# Patient Record
Sex: Male | Born: 1960 | Race: White | Hispanic: No | Marital: Married | Smoking: Current some day smoker
Health system: Southern US, Community
[De-identification: ages and names within clinical notes are randomized; demographics above are authoritative.]

## PROBLEM LIST (undated history)

## (undated) DIAGNOSIS — K219 Gastro-esophageal reflux disease without esophagitis: Secondary | ICD-10-CM

## (undated) DIAGNOSIS — E785 Hyperlipidemia, unspecified: Secondary | ICD-10-CM

## (undated) HISTORY — DX: Gastro-esophageal reflux disease without esophagitis: K21.9

## (undated) HISTORY — PX: OTHER SURGICAL HISTORY: SHX169

## (undated) HISTORY — DX: Hyperlipidemia, unspecified: E78.5

## (undated) HISTORY — PX: TONSILLECTOMY: SUR1361

---

## 2005-03-30 ENCOUNTER — Ambulatory Visit: Payer: Self-pay | Admitting: Family Medicine

## 2005-05-04 ENCOUNTER — Ambulatory Visit: Payer: Self-pay | Admitting: Family Medicine

## 2005-07-08 ENCOUNTER — Ambulatory Visit: Payer: Self-pay | Admitting: Family Medicine

## 2005-09-10 ENCOUNTER — Ambulatory Visit: Payer: Self-pay | Admitting: Family Medicine

## 2005-09-11 ENCOUNTER — Ambulatory Visit: Payer: Self-pay | Admitting: Internal Medicine

## 2006-01-19 ENCOUNTER — Ambulatory Visit: Payer: Self-pay | Admitting: Family Medicine

## 2006-01-19 ENCOUNTER — Encounter: Admission: RE | Admit: 2006-01-19 | Discharge: 2006-01-19 | Payer: Self-pay | Admitting: Family Medicine

## 2006-01-25 ENCOUNTER — Ambulatory Visit: Payer: Self-pay | Admitting: Family Medicine

## 2006-05-05 ENCOUNTER — Ambulatory Visit (HOSPITAL_COMMUNITY): Admission: RE | Admit: 2006-05-05 | Discharge: 2006-05-05 | Payer: Self-pay | Admitting: Neurosurgery

## 2006-05-19 ENCOUNTER — Encounter: Admission: RE | Admit: 2006-05-19 | Discharge: 2006-05-19 | Payer: Self-pay | Admitting: Neurosurgery

## 2006-05-20 ENCOUNTER — Ambulatory Visit (HOSPITAL_COMMUNITY): Admission: RE | Admit: 2006-05-20 | Discharge: 2006-05-21 | Payer: Self-pay | Admitting: Neurosurgery

## 2006-07-30 ENCOUNTER — Ambulatory Visit: Payer: Self-pay | Admitting: Family Medicine

## 2006-11-01 ENCOUNTER — Ambulatory Visit: Payer: Self-pay | Admitting: Family Medicine

## 2007-04-14 ENCOUNTER — Ambulatory Visit: Payer: Self-pay | Admitting: Family Medicine

## 2007-04-14 DIAGNOSIS — K5732 Diverticulitis of large intestine without perforation or abscess without bleeding: Secondary | ICD-10-CM | POA: Insufficient documentation

## 2007-04-14 DIAGNOSIS — E785 Hyperlipidemia, unspecified: Secondary | ICD-10-CM | POA: Insufficient documentation

## 2007-04-14 DIAGNOSIS — K219 Gastro-esophageal reflux disease without esophagitis: Secondary | ICD-10-CM

## 2007-04-14 LAB — CONVERTED CEMR LAB
Bilirubin Urine: NEGATIVE
Blood in Urine, dipstick: NEGATIVE
Glucose, Urine, Semiquant: NEGATIVE
Ketones, urine, test strip: NEGATIVE
Specific Gravity, Urine: 1.01
WBC Urine, dipstick: NEGATIVE
pH: 6.5

## 2007-06-22 ENCOUNTER — Ambulatory Visit: Payer: Self-pay | Admitting: Internal Medicine

## 2007-07-19 ENCOUNTER — Encounter: Payer: Self-pay | Admitting: Family Medicine

## 2007-07-19 ENCOUNTER — Ambulatory Visit: Payer: Self-pay | Admitting: Internal Medicine

## 2007-07-19 HISTORY — PX: ESOPHAGOGASTRODUODENOSCOPY: SHX1529

## 2007-07-19 HISTORY — PX: COLONOSCOPY: SHX174

## 2007-07-19 LAB — HM COLONOSCOPY

## 2007-11-11 ENCOUNTER — Ambulatory Visit: Payer: Self-pay | Admitting: Family Medicine

## 2007-11-11 DIAGNOSIS — J019 Acute sinusitis, unspecified: Secondary | ICD-10-CM

## 2008-08-17 ENCOUNTER — Ambulatory Visit: Payer: Self-pay | Admitting: Family Medicine

## 2008-08-17 DIAGNOSIS — M67919 Unspecified disorder of synovium and tendon, unspecified shoulder: Secondary | ICD-10-CM | POA: Insufficient documentation

## 2008-08-17 DIAGNOSIS — M719 Bursopathy, unspecified: Secondary | ICD-10-CM

## 2009-05-09 ENCOUNTER — Ambulatory Visit: Payer: Self-pay | Admitting: Family Medicine

## 2009-05-09 DIAGNOSIS — R071 Chest pain on breathing: Secondary | ICD-10-CM

## 2009-05-15 ENCOUNTER — Ambulatory Visit: Payer: Self-pay | Admitting: Family Medicine

## 2009-05-15 DIAGNOSIS — R079 Chest pain, unspecified: Secondary | ICD-10-CM

## 2009-07-02 ENCOUNTER — Ambulatory Visit: Payer: Self-pay | Admitting: Family Medicine

## 2009-07-02 DIAGNOSIS — N508 Other specified disorders of male genital organs: Secondary | ICD-10-CM

## 2009-07-05 ENCOUNTER — Encounter: Admission: RE | Admit: 2009-07-05 | Discharge: 2009-07-05 | Payer: Self-pay | Admitting: Family Medicine

## 2009-10-22 ENCOUNTER — Ambulatory Visit: Payer: Self-pay | Admitting: Family Medicine

## 2010-02-18 ENCOUNTER — Ambulatory Visit: Payer: Self-pay | Admitting: Family Medicine

## 2010-02-18 LAB — CONVERTED CEMR LAB
Glucose, Urine, Semiquant: NEGATIVE
Ketones, urine, test strip: NEGATIVE
Nitrite: NEGATIVE
Specific Gravity, Urine: 1.025
WBC Urine, dipstick: NEGATIVE
pH: 5.5

## 2010-02-20 LAB — CONVERTED CEMR LAB
Albumin: 4.7 g/dL (ref 3.5–5.2)
Basophils Absolute: 0 10*3/uL (ref 0.0–0.1)
CO2: 28 meq/L (ref 19–32)
Direct LDL: 181.2 mg/dL
Eosinophils Absolute: 0.1 10*3/uL (ref 0.0–0.7)
Glucose, Bld: 100 mg/dL — ABNORMAL HIGH (ref 70–99)
HCT: 46.6 % (ref 39.0–52.0)
Hemoglobin: 16 g/dL (ref 13.0–17.0)
Lymphs Abs: 2 10*3/uL (ref 0.7–4.0)
MCHC: 34.4 g/dL (ref 30.0–36.0)
Monocytes Absolute: 0.4 10*3/uL (ref 0.1–1.0)
Neutro Abs: 3.8 10*3/uL (ref 1.4–7.7)
Potassium: 4.8 meq/L (ref 3.5–5.1)
RDW: 13.3 % (ref 11.5–14.6)
Sodium: 140 meq/L (ref 135–145)
TSH: 0.77 microintl units/mL (ref 0.35–5.50)
Triglycerides: 361 mg/dL — ABNORMAL HIGH (ref 0.0–149.0)

## 2010-04-11 ENCOUNTER — Ambulatory Visit: Payer: Self-pay | Admitting: Family Medicine

## 2010-05-27 NOTE — Assessment & Plan Note (Signed)
Summary: TETANUS INJ (PT TO COME IN AROUND / BEFORE 4PM) OK PER JUDY,R...   Nurse Visit   Allergies: No Known Drug Allergies  Immunizations Administered:  Tetanus Vaccine:    Vaccine Type: Tdap    Site: left deltoid    Mfr: GlaxoSmithKline    Dose: 0.5 ml    Route: IM    Given by: Raechel Ache, RN    Exp. Date: 07/19/2011    Lot #: [id number]    VIS given: 03/15/07 version given October 22, 2009.  Orders Added: 1)  Tdap => 32yrs IM [90715] 2)  Admin 1st Vaccine [33295]

## 2010-05-27 NOTE — Assessment & Plan Note (Signed)
Summary: SOB/KH   Vital Signs:  Patient Profile:   50 Years Old Male CC:      SOB Height:     71 inches Weight:      235 pounds O2 Sat:      97 % Temp:     97.9 degrees F oral Pulse rate:   80 / minute BP sitting:   133 / 76  (right arm)  Vitals Entered By: Lita Mains                  Prior Medication List:  PROTONIX 40 MG  TBEC (PANTOPRAZOLE SODIUM) once daily   Current Allergies: No known allergies History of Present Illness History from: patient Chief Complaint: SOB History of Present Illness: Sinus congestionpast two weeks with drainage only in AM. Sneezed this AM then became SOB. SEVER PAIN RIGHT FLANK. PAIN WORSE WITH BREATHING OR COUGHING. PAIN RADIATES TO PROXIMAL ANT RIGHT MID THIGH. NO CHANGE IN BOWEL OR BLADDER CONTROL. NO UTI SYMPTOMS. NO NUMBNESS OR TINGLING. DOES HAVE HX OF PRIOR DISCECTOMY L4 L5 3 YRS AGO. STATES THIS DOES NOT FEEL LIKE HIS SPINE. DENIES FEVER.   REVIEW OF SYSTEMS Constitutional Symptoms      Denies fever, chills, night sweats, weight loss, weight gain, and fatigue.  Eyes       Denies change in vision, eye pain, eye discharge, glasses, contact lenses, and eye surgery. Ear/Nose/Throat/Mouth       Complains of sinus problems.      Denies hearing loss/aids, change in hearing, ear pain, ear discharge, dizziness, frequent runny nose, frequent nose bleeds, sore throat, hoarseness, and tooth pain or bleeding.  Respiratory       Complains of productive cough and shortness of breath.      Denies dry cough, wheezing, asthma, bronchitis, and emphysema/COPD.  Cardiovascular       Denies murmurs, chest pain, and tires easily with exhertion.    Gastrointestinal       Denies stomach pain, nausea/vomiting, diarrhea, constipation, blood in bowel movements, and indigestion. Genitourniary       Denies painful urination, kidney stones, and loss of urinary control. Neurological       Complains of tingling.      Denies paralysis, seizures, and  fainting/blackouts.      Comments: down R leg Musculoskeletal       Complains of muscle pain.      Denies joint pain, joint stiffness, decreased range of motion, redness, swelling, muscle weakness, and gout.      Comments: lower right side /back Skin       Denies bruising, unusual mles/lumps or sores, and hair/skin or nail changes.  Psych       Denies mood changes, temper/anger issues, anxiety/stress, speech problems, depression, and sleep problems.  Past History:  Family History: Last updated: 05/09/2009 Family History Diabetes 1st degree relative Family History Breast cancer 1st degree relative <50 Family History of Cardiovascular disorder-triple bypass and defibrilator-father  Past Medical History: GERD  Past Surgical History: Tonsillectomy Repair cruciate ligament tear left knee Lipomas removed LUMBAR DISCECTOMY L4L5  Family History: Family History Diabetes 1st degree relative Family History Breast cancer 1st degree relative <50 Family History of Cardiovascular disorder-triple bypass and defibrilator-father  Social History: Reviewed history from 04/14/2007 and no changes required. Married Never Smoked Alcohol use-yes Drug use-no Physical Exam General appearance: well developed, well nourished, no acute distress Chest/Lungs: no rales, wheezes, or rhonchi bilateral, breath sounds equal without effort Heart: regular rate and  rhythm, no murmur Abdomen: soft, non-tender without obvious organomegaly Back: TENDER IN RIGHT FLANK. SKIN CLEAR. NEG SLR. REFLEXES SYMETRICAL. N/V INTACT DISTALLY WILL CHECK CHEST XRAY. NEG Assessment New Problems: PAINFUL RESPIRATION (ICD-786.52) FAMILY HISTORY BREAST CANCER 1ST DEGREE RELATIVE <50 (ICD-V16.3)   Plan New Medications/Changes: HYDROCODONE-ACETAMINOPHEN 5-500 MG TABS (HYDROCODONE-ACETAMINOPHEN) 1-2 by mouth Q 6 HRS as needed PAIN  #15 x 0, 05/09/2009, Kimberly Lykins DO FLEXERIL 10 MG TABS (CYCLOBENZAPRINE HCL) 1 by mouth  three times a day PRN  #15 x 0, 05/09/2009, Kimberly Lykins DO MEDROL (PAK) 4 MG TABS (METHYLPREDNISOLONE) TAKE AS DIRECTED WITH FOOD  #1 x 0, 05/09/2009, Marvis Moeller DO  New Orders: T-Chest x-ray, 2 views [[id number]] New Patient Level III [99203]   Prescriptions: HYDROCODONE-ACETAMINOPHEN 5-500 MG TABS (HYDROCODONE-ACETAMINOPHEN) 1-2 by mouth Q 6 HRS as needed PAIN  #15 x 0   Entered and Authorized by:   Marvis Moeller DO   Signed by:   Marvis Moeller DO on 05/09/2009   Method used:   Print then Give to Patient   RxID:   [id number] FLEXERIL 10 MG TABS (CYCLOBENZAPRINE HCL) 1 by mouth three times a day PRN  #15 x 0   Entered and Authorized by:   Marvis Moeller DO   Signed by:   Marvis Moeller DO on 05/09/2009   Method used:   Print then Give to Patient   RxID:   [id number] MEDROL (PAK) 4 MG TABS (METHYLPREDNISOLONE) TAKE AS DIRECTED WITH FOOD  #1 x 0   Entered and Authorized by:   Marvis Moeller DO   Signed by:   Marvis Moeller DO on 05/09/2009   Method used:   Print then Give to Patient   RxID:   [id number]   Patient Instructions: 1)  APPLY HEAT THREE TIMES DAILY FOR 15 MIN. FOLLOW UP WITH YOUR PCP IF SYMPTOMS PERSIST OR WORSEN.

## 2010-05-27 NOTE — Assessment & Plan Note (Signed)
Summary: enlarge privacy x 2 weeks//ccm   Vital Signs:  Patient profile:   50 year old male Height:      71 inches Weight:      238 pounds BMI:     33.31 Temp:     98.2 degrees F oral BP sitting:   152 / 94  (left arm) Cuff size:   large  Vitals Entered By: Alfred Levins, CMA (July 02, 2009 4:38 PM) CC: one testicle larger than the other   History of Present Illness: Here for an enlargement of the right testicle that he first noticed one month ago. Both ans his wife have noticed this, and it seems to be slowly getting larger. There is no discomfort at all, no symptoms at all.   Allergies: No Known Drug Allergies  Past History:  Past Medical History: Reviewed history from 05/09/2009 and no changes required. GERD  Past Surgical History: Tonsillectomy Repair cruciate ligament tear left knee Lipomas removed Lumbar discectomy  L4-L5  Review of Systems  The patient denies anorexia, fever, weight loss, weight gain, vision loss, decreased hearing, hoarseness, chest pain, syncope, dyspnea on exertion, peripheral edema, prolonged cough, headaches, hemoptysis, abdominal pain, melena, hematochezia, severe indigestion/heartburn, hematuria, incontinence, genital sores, muscle weakness, suspicious skin lesions, transient blindness, difficulty walking, depression, unusual weight change, abnormal bleeding, enlarged lymph nodes, angioedema, breast masses, and testicular masses.    Physical Exam  General:  Well-developed,well-nourished,in no acute distress; alert,appropriate and cooperative throughout examination Abdomen:  Bowel sounds positive,abdomen soft and non-tender without masses, organomegaly or hernias noted. Genitalia:  Testes bilaterally descended without nodularity, tenderness or masses. No scrotal masses or lesions. No penis lesions or urethral discharge. The right testicle is indeed a bit larger than the left.    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 Once daily 2)  Ibuprofen 800 Mg Tabs (Ibuprofen) .... 4 times a day to reduce inflammation  Patient Instructions: 1)  this seems to be benign, but he is concerned about it. We will get a scrotal US soon

## 2010-05-27 NOTE — Assessment & Plan Note (Signed)
Summary: back pain/cdw   Vital Signs:  Patient profile:   50 year old male Weight:      238 pounds Temp:     97.6 degrees F oral Pulse rate:   106 / minute BP sitting:   114 / 84  (left arm) Cuff size:   large  Vitals Entered By: Alfred Levins, CMA (May 15, 2009 2:25 PM) CC: pt was seen in Urgent Care on the 13th for back pain.  he sneezed yesterday and started having severe pain.  He can't tolerate the flexeril or the hydrocodone   History of Present Illness: Was seen in Urgent Care on 05-09-09 for the sudden onset of severe sharp right sided chest and lower back pains which started immediately after he sneezed. No cough or fever. No true SOB but he had to take shallow breaths due to the pain. At Urgent Care he had a normal CXR. They were not sure of the diagnosis, but he was given a Medrol dose pack and Vicodin. He seemed to slowly improve, but then his pain came back just as severe this past weekend when he sneezed again. Today it happened again. No bowel or urinary symptoms. He is not taking anything for it. He has been able to work.   Current Medications (verified): 1)  Protonix 40 Mg  Tbec (Pantoprazole Sodium) .... Once Daily  Allergies (verified): No Known Drug Allergies  Past History:  Past Medical History: Reviewed history from 04/14/2007 and no changes required. GERD Hyperlipidemia  Past Surgical History: Reviewed history from 08/17/2008 and no changes required. Tonsillectomy Repair cruciate ligament tear left knee Lipomas removed  Review of Systems  The patient denies anorexia, fever, weight loss, weight gain, vision loss, decreased hearing, hoarseness, syncope, dyspnea on exertion, peripheral edema, prolonged cough, headaches, hemoptysis, abdominal pain, melena, hematochezia, severe indigestion/heartburn, hematuria, incontinence, genital sores, muscle weakness, suspicious skin lesions, transient blindness, difficulty walking, depression, unusual weight change,  abnormal bleeding, enlarged lymph nodes, angioedema, breast masses, and testicular masses.    Physical Exam  General:  Well-developed,well-nourished,in no acute distress; alert,appropriate and cooperative throughout examination Neck:  No deformities, masses, or tenderness noted. Chest Wall:  No deformities, masses, tenderness or gynecomastia noted. Lungs:  Normal respiratory effort, chest expands symmetrically. Lungs are clear to auscultation, no crackles or wheezes.  he clearly has pain on deep inspirations Heart:  Normal rate and regular rhythm. S1 and S2 normal without gallop, murmur, click, rub or other extra sounds. Msk:  No deformity or scoliosis noted of thoracic or lumbar spine.     Impression & Recommendations:  Problem # 1:  CHEST PAIN UNSPECIFIED (ICD-786.50)  Complete Medication List: 1)  Protonix 40 Mg Tbec (Pantoprazole sodium) .... Once daily 2)  Ibuprofen 800 Mg Tabs (Ibuprofen) .... 4 times a day to reduce inflammation  Other Orders: T-2 View CXR (71020TC)  Patient Instructions: 1)  Most likely this represents a torn rib cage muscle or a cracked rib, but pneumothorax is a possibility. Will get a stat CXR and go from there.

## 2010-05-29 NOTE — Assessment & Plan Note (Signed)
Summary: cpx/cjr/pt rescd//ccm   Vital Signs:  Patient profile:   50 year old male Height:      70.25 inches Weight:      248 pounds BMI:     35.46 O2 Sat:      98 % on Room air Pulse rate:   81 / minute BP sitting:   120 / 80  (left arm) Cuff size:   large  Vitals Entered By: Romualdo Bolk, CMA (AAMA) (April 11, 2010 9:06 AM)  O2 Flow:  Room air CC: CPX   History of Present Illness: 50 yr old male for a cpx. he feels fine and has no complaints. We discussed his high lipid levels, and it seems he already eats a fairly healthy diet. He plays golf twice a week, and he has a 3 handicap.   Preventive Screening-Counseling & Management  Alcohol-Tobacco     Smoking Status: never  Current Medications (verified): 1)  Protonix 40 Mg  Tbec (Pantoprazole Sodium) .... Once Daily  Allergies (verified): No Known Drug Allergies  Past History:  Past Medical History: GERD left scrotal varicocele per [place] in March 2011  Past Surgical History: Reviewed history from 07/02/2009 and no changes required. Tonsillectomy Repair cruciate ligament tear left knee Lipomas removed Lumbar discectomy  L4-L5  Family History: Reviewed history from 05/09/2009 and no changes required. Family History Diabetes 1st degree relative Family History Breast cancer 1st degree relative <50 Family History of Cardiovascular disorder-triple bypass and defibrilator-father  Social History: Reviewed history from 04/14/2007 and no changes required. Married Never Smoked Alcohol use-yes Drug use-no  Review of Systems  The patient denies anorexia, fever, weight loss, weight gain, vision loss, decreased hearing, hoarseness, chest pain, syncope, dyspnea on exertion, peripheral edema, prolonged cough, headaches, hemoptysis, abdominal pain, melena, hematochezia, severe indigestion/heartburn, hematuria, incontinence, genital sores, muscle weakness, suspicious skin lesions, transient blindness, difficulty  walking, depression, unusual weight change, abnormal bleeding, enlarged lymph nodes, angioedema, breast masses, and testicular masses.    Physical Exam  General:  overweight-appearing.   Head:  Normocephalic and atraumatic without obvious abnormalities. No apparent alopecia or balding. Eyes:  No corneal or conjunctival inflammation noted. EOMI. Perrla. Funduscopic exam benign, without hemorrhages, exudates or papilledema. Vision grossly normal. Ears:  External ear exam shows no significant lesions or deformities.  Otoscopic examination reveals clear canals, tympanic membranes are intact bilaterally without bulging, retraction, inflammation or discharge. Hearing is grossly normal bilaterally. Nose:  External nasal examination shows no deformity or inflammation. Nasal mucosa are pink and moist without lesions or exudates. Mouth:  Oral mucosa and oropharynx without lesions or exudates.  Teeth in good repair. Neck:  No deformities, masses, or tenderness noted. Chest Wall:  No deformities, masses, tenderness or gynecomastia noted. Lungs:  Normal respiratory effort, chest expands symmetrically. Lungs are clear to auscultation, no crackles or wheezes. Heart:  Normal rate and regular rhythm. S1 and S2 normal without gallop, murmur, click, rub or other extra sounds. Abdomen:  Bowel sounds positive,abdomen soft and non-tender without masses, organomegaly or hernias noted. Genitalia:  Testes bilaterally descended without nodularity, tenderness or masses. No scrotal masses or lesions. No penis lesions or urethral discharge. Msk:  No deformity or scoliosis noted of thoracic or lumbar spine.   Pulses:  R and L carotid,radial,femoral,dorsalis pedis and posterior tibial pulses are full and equal bilaterally Extremities:  No clubbing, cyanosis, edema, or deformity noted with normal full range of motion of all joints.   Neurologic:  No cranial nerve deficits noted. Station and  gait are normal. Plantar reflexes are  down-going bilaterally. DTRs are symmetrical throughout. Sensory, motor and coordinative functions appear intact. Skin:  Intact without suspicious lesions or rashes Cervical Nodes:  No lymphadenopathy noted Axillary Nodes:  No palpable lymphadenopathy Inguinal Nodes:  No significant adenopathy Psych:  Cognition and judgment appear intact. Alert and cooperative with normal attention span and concentration. No apparent delusions, illusions, hallucinations   Impression & Recommendations:  Problem # 1:  HEALTH MAINTENANCE EXAM (ICD-V70.0)  Problem # 2:  HYPERLIPIDEMIA (ICD-272.4)  His updated medication list for this problem includes:    Lipitor 40 Mg Tabs (Atorvastatin calcium) ..... Once daily  Complete Medication List: 1)  Protonix 40 Mg Tbec (Pantoprazole sodium) .... Once daily 2)  Lipitor 40 Mg Tabs (Atorvastatin calcium) .... Once daily  Patient Instructions: 1)  Please schedule a follow-up appointment in 3 months .  2)  It is important that you exercise reguarly at least 20 minutes 5 times a week. If you develop chest pain, have severe difficulty breathing, or feel very tired, stop exercising immediately and seek medical attention.  3)  You need to lose weight. Consider a lower calorie diet and regular exercise.  Prescriptions: LIPITOR 40 MG TABS (ATORVASTATIN CALCIUM) once daily  #30 x 11   Entered and Authorized by:   Nelwyn Salisbury MD   Signed by:   Nelwyn Salisbury MD on 04/11/2010   Method used:   Electronically to        [facility]  [phone]* (retail)       [address] [place], [place]  [address]       Ph: [phone] or [phone]       Fax: [phone]   RxID:   [id number]    Orders Added: 1)  Est. Patient 40-64 years [phone]

## 2010-07-03 ENCOUNTER — Other Ambulatory Visit: Payer: Self-pay | Admitting: Family Medicine

## 2010-07-09 ENCOUNTER — Ambulatory Visit (INDEPENDENT_AMBULATORY_CARE_PROVIDER_SITE_OTHER): Payer: BC Managed Care – PPO | Admitting: Family Medicine

## 2010-07-09 ENCOUNTER — Encounter: Payer: Self-pay | Admitting: Family Medicine

## 2010-07-09 VITALS — BP 120/80 | HR 83 | Temp 97.8°F | Wt 244.0 lb

## 2010-07-09 DIAGNOSIS — M26629 Arthralgia of temporomandibular joint, unspecified side: Secondary | ICD-10-CM

## 2010-07-09 DIAGNOSIS — M2669 Other specified disorders of temporomandibular joint: Secondary | ICD-10-CM

## 2010-07-09 NOTE — Progress Notes (Signed)
  Subjective:    Patient ID: [name], male    DOB: Jan 29, 1961, 50 y.o.   MRN: [id number]  HPI Here for 3 days of pain in the left ear and in  front of the ear. No discharge. No other symptoms. Motrin has helped.    Review of Systems  Constitutional: Negative.   HENT: Positive for ear pain. Negative for hearing loss, congestion, postnasal drip, sinus pressure and ear discharge.   Neurological: Negative for headaches.       Objective:   Physical Exam  Constitutional: He appears well-developed and well-nourished.  HENT:  Head: Normocephalic and atraumatic.  Right Ear: External ear normal.  Left Ear: External ear normal.  Nose: Nose normal.  Mouth/Throat: Oropharynx is clear and moist.       Tender over the left TMJ. Full ROM, no crepitus  Neck: Normal range of motion. Neck supple.          Assessment & Plan:  This is TMJ pain. No chewing gum. Use Motrin.

## 2010-09-09 NOTE — Assessment & Plan Note (Signed)
[name]                         GASTROENTEROLOGY OFFICE NOTE   NAME:[name]                         MRN:          [id number]  DATE:06/22/2007                            DOB:          June 11, 1960    CHIEF COMPLAINT:  Rectal bleeding.   ASSESSMENT:  A 50 year old white man, originally from [name], who  had rectal bleeding off and on for several months.  It was associated  with some diarrhea and abdominal pain.  It appeared to resolve after a  treatment for suspected diverticulitis with Cipro.   He also has chronic gastroesophageal reflux disease and incomplete  symptom control, mainly dietary or alcohol-related, but it does not  sound like he has complete control.  He had an endoscopy eight to ten  years ago in [name] and probably had a colonoscopy around that  time and remembers being told he should have them regularly, but really  cannot remember other circumstances of that.   RECOMMENDATIONS AND PLAN:  Though he is asymptomatic with respect to the  rectal bleeding, at this time colonoscopy is appropriate to exclude  causes more serious than anorectal.  Risks, benefits, and indications  are explained and he understands and agrees to proceed.  In addition, an  upper endoscopy would be appropriate, given the chronic reflux disease  and incomplete control.  He may need some medication modification.  We  talked about reducing caffeine and losing weight some, as well.   HISTORY:  This 50 year old white man from [name] has had problems  in November or December, was having loose bowels, crampy lower or mid-  quadrant abdominal pain, and rectal bleeding.  He was using a lot of  Advil at the time for chronic back pain.  He stopped the Advil, which  seemed to stop the bleeding, and the symptoms all resolved after  December, after he took a course of Cipro.  [name] thought he might  have had diverticulitis at the time.  There is  no known history of  diverticulosis at this time, through imaging, etc., but that was the  working diagnosis.  He also has had nausea and vomiting, but that has  resolved, as well.  He has been taking Protonix daily, had previously  taken Losec in [place] for more than ten years.  He will have heartburn  in the morning, if he eats before his medication has been in his system  for an hour.  If he over-drinks or has spicy food, he will also have  symptoms and sometimes without that, as well.  There is no dysphagia.   MEDICATIONS:  Protonix 40 mg daily.   ALLERGIES:  There are no known drug allergies.   PAST MEDICAL HISTORY:  1. Gastroesophageal reflux disease.  2. Left-knee surgery.  3. Two prior back surgeries.  He had lumbar surgery and then, two or      three days later, had to have repeat surgery, due to some sort of      problem.  He does have chronic back pain.  4. He has had forearm surgery.  5. He has also had lipoma surgery.   FAMILY HISTORY:  Heart disease in his father, diabetes in a grandmother.  There is no colon cancer.   SOCIAL HISTORY:  He is married.  He is a former [name].  He lives with his wife and daughter.  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  Social alcohol use, no tobacco or drugs.   REVIEW OF SYSTEMS:  Chronic back pain, joint pains.  All other systems  are negative, or as mentioned above.   PHYSICAL EXAM:  Well-developed, well-developed, overweight to obese  white man.  Height 5 feet 10, weight 239 pounds, blood pressure is 120/86, pulse 64.  EYES:  Anicteric.  ENT:  Normal mouth and posterior pharynx.  NECK:  Supple, no thyromegaly, no mass.  CHEST:  Clear, resonant.  HEART:  S1, S2.  I hear no rubs, murmurs or gallops.  ABDOMEN:  Soft, nontender, no organomegaly or mass.  RECTAL EXAM:  Deferred until colonoscopy.  LOWER EXTREMITIES:  Free of edema.  There are surgical scars on the knee  and the low back and from his  lipoma excisions.  SKIN:  Without rash or other lesions.  NEUROLOGIC:  Cranial nerves II through XII intact.  He is alert and  oriented times three.  PSYCHIATRIC:  Appropriate mood and affect.  LYMPH NODES:  No neck or supraclavicular nodes palpated.   I have reviewed Dr. Claris [name] office notes.   I appreciate the opportunity to care for this patient.     [name] Boop, MD,FACG  Electronically Signed    CEG/MedQ  DD: 06/22/2007  DT: 06/22/2007  Job #: [id number]   cc:   [name] A. [name], MD

## 2010-09-12 NOTE — Op Note (Signed)
NAMEASHETON, SCHEFFLER                ACCOUNT NO.:  [id number]   MEDICAL RECORD NO.:  [id number]          PATIENT TYPE:  OIB   LOCATION:  3172                         FACILITY:  [facility]   PHYSICIAN:  Donalee Citrin, M.D.        DATE OF BIRTH:  08/13/1960   DATE OF PROCEDURE:  05/20/2006  DATE OF DISCHARGE:                               OPERATIVE REPORT   PREOPERATIVE DIAGNOSES:  Either recurrent herniated nucleus pulposus, L4-  5, or epidural hematoma, L4-5, with recurrent right L5 radiculopathy.   PROCEDURES:  Reexploration of right L4-5 diskectomy and laminectomy,  with redo right L5 diskectomy and microscopic dissection of right L5  nerve root and evacuation of dorsal dural hematoma.   SURGEON:  Donalee Citrin, M.D.   ASSISTANT:  Tia Alert, MD.   ANESTHESIA:  General endotracheal.   HISTORY OF PRESENT ILLNESS:  The patient is a very pleasant 50 year old  gentleman, who underwent surgery 2 weeks ago for a right L4-5 ruptured  disk.  Postoperatively, the patient did very well, however, started  developing right hip and leg pain that recurred, refractory to oral  steroids.  Underwent repeat MRI scan, which showed what appeared to be  an epidural hematoma versus a recurrent disk herniation causing severe  mass effect on the right L5 nerve root and the thecal sac.  The patient  was recommended reexploration of the lumbar wound for evacuation of  hematoma and redo diskectomy.  The risks and benefits of the proposed  operation were explained to the patient, who understands and agrees to  proceed forward.   DESCRIPTION OF PROCEDURES:  The patient was brought to the OR and was  induced under general anesthesia and was positioned prone on the Wilson  frame.  The back was prepped and draped in the usual sterile fashion.  The old incision was opened up and the sutures were incised.  As soon as  the skin was opened up, some seromatous fluid immediately came out under  pressure.  This was  aspirated.  Then, the fascial stitches were incised.  The muscle was then reflected.  The self-retaining retractor was placed.  The operating microscope was draped and brought into the field, and  under microscopic illumination, the Gelfoam that had been overlaid on  top of the dura was removed.  The thecal sac was immediately visualized.  The L5 nerve root was immediately visualized and noted to be very tense  and stuck to the underlying disk space.  Marked hematoma, in addition to  the Gelfoam, was immediately evacuated on the dorsal aspect of the dura,  and then the L5 nerve root was mobilized.  This was very difficult to  mobilize off the disk space.  There was disk that presented in the  axilla.  Again, this was easily removed.  Then, the D'Errico was used to  reflect the thecal sac medially in the axilla, and additional disk  fragments were removed and expressed.  Then, the L5 nerve root was  mobilized again.  Still difficult gaining access to the lateral disk  space.  The  inferior aspect of the lamina of L4 was bitten off a little  bit to gain more cephalad exposure, and then this allowed more lateral  displacement and mobilization of the L5 nerve root and several large  fragments of disk were immediately expressed and removed from the disk  space, working both within the axilla and from lateral to the nerve.  This immediately allowed the L5 nerve root to be mobilized.  It resumed  its normal location without being displaced laterally.  Then, additional  disk fragments were teased down with Epstein curets.  There were more  fragments removed from the disk space.  The lateral compartment was  cleaned out.  The medial compartment was cleaned out.  The wound was  explored with a coronary dilator and angled hockey stick and noted to be  widely mobile and free and clear.  Then, the wound was copiously  irrigated.  Meticulous hemostasis was maintained.  Some Depo-Medrol was  placed on the  L4 nerve root itself, and a small piece of Gelfoam  overlying the dura.  The muscle and fascia, after meticulous hemostasis  had been maintained, were reapproximated with interrupted Vicryl.  The  skin was closed with a running 4-0 subcuticular.  Benzoin and Steri-  Strips were applied.  The patient went to the recovery room in stable  condition.  At the end of the case, needle and sponge counts were  reported correct.           ______________________________  Donalee Citrin, M.D.     GC/MEDQ  D:  05/20/2006  T:  05/20/2006  Job:  [id number]   cc:   Tia Alert, MD

## 2010-09-12 NOTE — Op Note (Signed)
NAMEPHILLIPPE, [name] [name]                ACCOUNT NO.:  [id number]   MEDICAL RECORD NO.:  [id number]          PATIENT TYPE:  OIB   LOCATION:  3028                         FACILITY:  [facility]   PHYSICIAN:  Donalee Citrin, M.D.        DATE OF BIRTH:  [birth year]   DATE OF PROCEDURE:  05/05/2006  DATE OF DISCHARGE:  05/05/2006                               OPERATIVE REPORT   PREOPERATIVE DIAGNOSIS:  Right L5 radiculopathy from ruptured disk, L4-  5, right.   PROCEDURE:  Lumbar laminectomy, microdiskectomy, L4-5, right, with  microscopic diskectomy and microscopic dissection of the right L5 nerve  root.   SURGEON:  Wynetta Emery.   ASSISTANTYetta Barre.   ANESTHESIA:  General endotracheal.   HISTORY OF PRESENT ILLNESS:  The patient is a very pleasant 50 year old  gentleman who has had longstanding back and predominantly right leg pain  radiating to his hip, down the back of his leg, to the top of his foot  and big toe.  The patient failed all forms of conservative treatment  with physical therapy, anti-inflammatories, epidural steroid injections,  and the patient was recommended laminectomy and microdiskectomy.  Risks  and benefits of the operation were explained to the patient.  He  understood and agreed to proceed forward.   DESCRIPTION OF PROCEDURE:  The patient was brought to the OR.  He was  induced under general anesthesia and placed prone on the Wilson frame.  Prepped and draped in a routine sterile fashion.  Preoperative x-ray  localized the L4-5 disk space, and then after infiltration of 10 mL  lidocaine with epinephrine, a midline incision made.  Bovie  electrocautery was used to take down to the subperiosteal dissection  carried out at lamina of L4 and L5.  Intraoperative x-ray confirmed  localization of the appropriate level.  The __________ L4.  Medial facet  __________  removed in piecemeal fashion with a 2-mm Kerrison punch, as  well as a high-speed drill used to drill down the medial facet  complex.  The ligament was identified, teased off the dura with a #4 Penfield,  removed in piecemeal fashion, exposing the lateral gutter, proximal  right L5 nerve root.  Then, after the L5 nerve root was unroofed from  its foramen, there was disk immediately presenting from within the  axilla.  This was removed in piecemeal fashion with thin shaped  pituitaries.  Then, the L5 nerve root was mobilized off the annulus and  was noted to be markedly bulging and displacing the thecal sac.  This  was then incised with an 11-blade scalpel.  Pituitary rongeurs were used  to clean up the disk space.  Then, attention was taken back into the  axilla.  Several more fragments of disk were removed and working from  both in the axilla and outside the axilla, reflecting the L5 nerve root  medially, the disk space was adequately cleaned out.  At the end of the  diskectomy, there was no further stenosis, no further fragments  appreciated within the disk space, no further compression of the thecal  sac, and  the L5 neural foramen was widely patent.  The wounds were  copiously irrigated.  Meticulous hemostasis was maintained.  Gelfoam was laid on top of the dura.  The muscle and fascia were  reapproximated in layers with interrupted Vicryl, and skin was closed  with running 4-0 subcuticular.  Benzoin and Steri-Strips were applied.  The patient went to the recovery room in stable condition.  At the end  of the case, all needle, instrument, and sponge counts were correct.           ______________________________  Donalee Citrin, M.D.     GC/MEDQ  D:  05/05/2006  T:  05/06/2006  Job:  [id number]

## 2010-12-19 ENCOUNTER — Other Ambulatory Visit: Payer: Self-pay | Admitting: Family Medicine

## 2010-12-23 NOTE — Telephone Encounter (Signed)
Script sent e-scribe 

## 2011-03-12 ENCOUNTER — Other Ambulatory Visit: Payer: Self-pay | Admitting: Family Medicine

## 2011-04-13 ENCOUNTER — Other Ambulatory Visit: Payer: Self-pay | Admitting: Family Medicine

## 2011-05-29 ENCOUNTER — Ambulatory Visit (INDEPENDENT_AMBULATORY_CARE_PROVIDER_SITE_OTHER): Payer: BC Managed Care – PPO | Admitting: Family Medicine

## 2011-05-29 ENCOUNTER — Encounter: Payer: Self-pay | Admitting: Family Medicine

## 2011-05-29 VITALS — BP 120/86 | HR 87 | Temp 97.7°F | Wt 245.0 lb

## 2011-05-29 DIAGNOSIS — N419 Inflammatory disease of prostate, unspecified: Secondary | ICD-10-CM

## 2011-05-29 MED ORDER — CIPROFLOXACIN HCL 500 MG PO TABS: 500.0000 mg | ORAL_TABLET | Freq: Two times a day (BID) | ORAL | Status: AC

## 2011-05-29 NOTE — Progress Notes (Signed)
  Subjective:    Patient ID: [name] [name], male    DOB: 12-23-1960, 51 y.o.   MRN: [id number]  HPI Here for 2 weeks of intermittent sharp suprapubic pains. No urinary urgency or burning. No change in BMs. No fevers. No urethral DC. No testicular pains.    Review of Systems  Constitutional: Negative.   Respiratory: Negative.   Cardiovascular: Negative.   Gastrointestinal: Positive for abdominal pain. Negative for nausea, vomiting, diarrhea, constipation, blood in stool, abdominal distention and rectal pain.  Genitourinary: Negative.        Objective:   Physical Exam  Constitutional: He appears well-developed and well-nourished.  Abdominal: Soft. Bowel sounds are normal. He exhibits no distension and no mass. There is no rebound and no guarding. Hernia confirmed negative in the right inguinal area and confirmed negative in the left inguinal area.       Mildly tender above the pubis   Genitourinary: Rectum normal and testes normal. Right testis shows no mass, no swelling and no tenderness. Left testis shows no mass, no swelling and no tenderness.       The prostate is mildly enlarged and boggy but not tender   Lymphadenopathy:       Right: No inguinal adenopathy present.       Left: No inguinal adenopathy present.          Assessment & Plan:  Probable prostatitis. Recheck prn

## 2011-06-07 ENCOUNTER — Other Ambulatory Visit: Payer: Self-pay | Admitting: Family Medicine

## 2011-06-10 ENCOUNTER — Encounter: Payer: Self-pay | Admitting: Family Medicine

## 2011-06-10 ENCOUNTER — Ambulatory Visit (INDEPENDENT_AMBULATORY_CARE_PROVIDER_SITE_OTHER): Payer: BC Managed Care – PPO | Admitting: Family Medicine

## 2011-06-10 VITALS — BP 136/86 | HR 86 | Temp 98.2°F | Wt 248.0 lb

## 2011-06-10 DIAGNOSIS — R1030 Lower abdominal pain, unspecified: Secondary | ICD-10-CM

## 2011-06-10 DIAGNOSIS — R109 Unspecified abdominal pain: Secondary | ICD-10-CM

## 2011-06-10 LAB — POCT URINALYSIS DIPSTICK
Bilirubin, UA: NEGATIVE
Blood, UA: NEGATIVE
Ketones, UA: NEGATIVE
Spec Grav, UA: 1.025
pH, UA: 6

## 2011-06-10 MED ORDER — PANTOPRAZOLE SODIUM 40 MG PO TBEC: 40.0000 mg | DELAYED_RELEASE_TABLET | Freq: Two times a day (BID) | ORAL | Status: DC

## 2011-06-10 MED ORDER — ATORVASTATIN CALCIUM 40 MG PO TABS: 40.0000 mg | ORAL_TABLET | Freq: Every day | ORAL | Status: DC

## 2011-06-10 NOTE — Progress Notes (Signed)
  Subjective:    Patient ID: [name] [name], male    DOB: 03-07-1961, 51 y.o.   MRN: [id number]  HPI Here for continued suprapubic pains. He was seen here on 05-29-11 for this, and it was felt to be a prostate infection. He was placed on Cipro, and in fact he felt better for a week. Then th pains returned, and today they are worse than ever. They are sharp fleeting pains that only last several seconds at a time. They are still just above the pubis. He has no pain on urinating. No blood in the urine. No nausea or fever. BMs are regular.    Review of Systems  Constitutional: Negative.   Respiratory: Negative.   Cardiovascular: Negative.   Gastrointestinal: Positive for abdominal pain. Negative for nausea, vomiting, diarrhea, constipation, blood in stool, abdominal distention and rectal pain.  Genitourinary: Negative.        Objective:   Physical Exam  Constitutional:       He winces with pain every 30-60 seconds, otherwise he looks fine  Abdominal: Soft. Bowel sounds are normal. He exhibits no distension and no mass. There is no rebound and no guarding.       Moderately tender in a very specific spot above the pubis, but in no other areas           Assessment & Plan:  It is not clear what the etiology of these pains may be. Bladder stones are possible or hernia or urachal cyst. Get labs today and set up a contrasted CT of abdomen and pelvis soon.

## 2011-06-11 LAB — HEPATIC FUNCTION PANEL
Albumin: 4.9 g/dL (ref 3.5–5.2)
Alkaline Phosphatase: 68 U/L (ref 39–117)
Bilirubin, Direct: 0 mg/dL (ref 0.0–0.3)

## 2011-06-11 LAB — CBC WITH DIFFERENTIAL/PLATELET
Basophils Absolute: 0 10*3/uL (ref 0.0–0.1)
HCT: 46.6 % (ref 39.0–52.0)
Hemoglobin: 16 g/dL (ref 13.0–17.0)
Lymphs Abs: 2.3 10*3/uL (ref 0.7–4.0)
Monocytes Relative: 7.1 % (ref 3.0–12.0)
Neutro Abs: 3.1 10*3/uL (ref 1.4–7.7)
RDW: 13.6 % (ref 11.5–14.6)

## 2011-06-11 LAB — BASIC METABOLIC PANEL
Calcium: 9.8 mg/dL (ref 8.4–10.5)
GFR: 89.51 mL/min (ref >60.00)
Glucose, Bld: 100 mg/dL — ABNORMAL HIGH (ref 70–99)
Sodium: 139 mEq/L (ref 135–145)

## 2011-06-12 ENCOUNTER — Ambulatory Visit (INDEPENDENT_AMBULATORY_CARE_PROVIDER_SITE_OTHER)
Admission: RE | Admit: 2011-06-12 | Discharge: 2011-06-12 | Disposition: A | Discharge status: Home / Self Care | Payer: BC Managed Care – PPO | Source: Ambulatory Visit | Attending: Cardiology | Admitting: Cardiology

## 2011-06-12 DIAGNOSIS — R109 Unspecified abdominal pain: Secondary | ICD-10-CM

## 2011-06-12 DIAGNOSIS — R1030 Lower abdominal pain, unspecified: Secondary | ICD-10-CM

## 2011-06-12 MED ORDER — IOHEXOL 300 MG/ML  SOLN
100.0000 mL | Freq: Once | INTRAMUSCULAR | Status: AC | PRN
  Administered 2011-06-12: 100 mL via INTRAVENOUS

## 2011-06-12 NOTE — Progress Notes (Signed)
Quick Note:  Pt aware and states he will call back at the beginning of next week to update on how he is feeling. ______

## 2011-06-12 NOTE — Progress Notes (Signed)
Quick Note:  Left voice message ______ 

## 2011-07-17 ENCOUNTER — Other Ambulatory Visit: Payer: Self-pay | Admitting: Family Medicine

## 2012-05-17 ENCOUNTER — Other Ambulatory Visit (INDEPENDENT_AMBULATORY_CARE_PROVIDER_SITE_OTHER): Payer: BC Managed Care – PPO

## 2012-05-17 DIAGNOSIS — Z0000 Encounter for general adult medical examination without abnormal findings: Secondary | ICD-10-CM

## 2012-05-17 LAB — POCT URINALYSIS DIPSTICK
Bilirubin, UA: NEGATIVE
Glucose, UA: NEGATIVE
Leukocytes, UA: NEGATIVE
Nitrite, UA: NEGATIVE
Urobilinogen, UA: 0.2
pH, UA: 5.5

## 2012-05-17 LAB — LIPID PANEL: HDL: 42 mg/dL (ref >39.00)

## 2012-05-17 LAB — BASIC METABOLIC PANEL
BUN: 18 mg/dL (ref 6–23)
CO2: 27 mEq/L (ref 19–32)
Chloride: 106 mEq/L (ref 96–112)
Creatinine, Ser: 0.9 mg/dL (ref 0.4–1.5)
Glucose, Bld: 116 mg/dL — ABNORMAL HIGH (ref 70–99)

## 2012-05-17 LAB — CBC WITH DIFFERENTIAL/PLATELET
Eosinophils Absolute: 0.2 10*3/uL (ref 0.0–0.7)
Lymphs Abs: 1.7 10*3/uL (ref 0.7–4.0)
MCHC: 34.1 g/dL (ref 30.0–36.0)
MCV: 85.3 fl (ref 78.0–100.0)
Monocytes Absolute: 0.4 10*3/uL (ref 0.1–1.0)
Neutrophils Relative %: 53.4 % (ref 43.0–77.0)
Platelets: 155 10*3/uL (ref 150.0–400.0)

## 2012-05-17 LAB — LDL CHOLESTEROL, DIRECT: Direct LDL: 110 mg/dL

## 2012-05-17 LAB — HEPATIC FUNCTION PANEL
Bilirubin, Direct: 0.1 mg/dL (ref 0.0–0.3)
Total Bilirubin: 0.8 mg/dL (ref 0.3–1.2)
Total Protein: 7.4 g/dL (ref 6.0–8.3)

## 2012-05-17 LAB — TSH: TSH: 0.38 u[IU]/mL (ref 0.35–5.50)

## 2012-05-17 LAB — PSA: PSA: 0.59 ng/mL (ref 0.10–4.00)

## 2012-05-20 ENCOUNTER — Ambulatory Visit (INDEPENDENT_AMBULATORY_CARE_PROVIDER_SITE_OTHER): Payer: BC Managed Care – PPO | Admitting: Family Medicine

## 2012-05-20 ENCOUNTER — Encounter: Payer: Self-pay | Admitting: Family Medicine

## 2012-05-20 VITALS — BP 130/86 | HR 87 | Temp 98.2°F | Ht 70.5 in | Wt 240.0 lb

## 2012-05-20 DIAGNOSIS — Z0000 Encounter for general adult medical examination without abnormal findings: Secondary | ICD-10-CM

## 2012-05-20 MED ORDER — PANTOPRAZOLE SODIUM 40 MG PO TBEC: 40.0000 mg | DELAYED_RELEASE_TABLET | Freq: Two times a day (BID) | ORAL | Status: DC

## 2012-05-20 MED ORDER — ATORVASTATIN CALCIUM 40 MG PO TABS: 40.0000 mg | ORAL_TABLET | Freq: Every day | ORAL | Status: DC

## 2012-05-20 NOTE — Progress Notes (Signed)
  Subjective:    Patient ID: [name] [name], male    DOB: [birth year], 52 y.o.   MRN: [id number]  HPI 52 yr old male for a cpx. He feels well ands has no concerns. His lipids have come down nicely with diet and meds but his glucose has crept up to 116 fasting. It sounds like he eats a healthy diet but he admits that he has not exercised much lately.    Review of Systems  Constitutional: Negative.   HENT: Negative.   Eyes: Negative.   Respiratory: Negative.   Cardiovascular: Negative.   Gastrointestinal: Negative.   Genitourinary: Negative.   Musculoskeletal: Negative.   Skin: Negative.   Neurological: Negative.   Hematological: Negative.   Psychiatric/Behavioral: Negative.        Objective:   Physical Exam  Constitutional: He is oriented to person, place, and time. He appears well-developed and well-nourished. No distress.  HENT:  Head: Normocephalic and atraumatic.  Right Ear: External ear normal.  Left Ear: External ear normal.  Nose: Nose normal.  Mouth/Throat: Oropharynx is clear and moist. No oropharyngeal exudate.  Eyes: Conjunctivae normal and EOM are normal. Pupils are equal, round, and reactive to light. Right eye exhibits no discharge. Left eye exhibits no discharge. No scleral icterus.  Neck: Neck supple. No JVD present. No tracheal deviation present. No thyromegaly present.  Cardiovascular: Normal rate, regular rhythm, normal heart sounds and intact distal pulses.  Exam reveals no gallop and no friction rub.   No murmur heard.      EKG normal   Pulmonary/Chest: Effort normal and breath sounds normal. No respiratory distress. He has no wheezes. He has no rales. He exhibits no tenderness.  Abdominal: Soft. Bowel sounds are normal. He exhibits no distension and no mass. There is no tenderness. There is no rebound and no guarding.  Genitourinary: Rectum normal, prostate normal and penis normal. Guaiac negative stool. No penile tenderness.  Musculoskeletal: Normal range  of motion. He exhibits no edema and no tenderness.  Lymphadenopathy:    He has no cervical adenopathy.  Neurological: He is alert and oriented to person, place, and time. He has normal reflexes. No cranial nerve deficit. He exhibits normal muscle tone. Coordination normal.  Skin: Skin is warm and dry. No rash noted. He is not diaphoretic. No erythema. No pallor.  Psychiatric: He has a normal mood and affect. His behavior is normal. Judgment and thought content normal.          Assessment & Plan:  Well exam. He will exercise more. Recheck a glucose in 6 months

## 2012-05-20 NOTE — Progress Notes (Signed)
Quick Note:  Pt is here now for his CPE, will go over then. ______

## 2012-07-10 ENCOUNTER — Other Ambulatory Visit: Payer: Self-pay | Admitting: Family Medicine

## 2013-06-08 ENCOUNTER — Telehealth: Payer: Self-pay | Admitting: Family Medicine

## 2013-06-08 MED ORDER — PANTOPRAZOLE SODIUM 40 MG PO TBEC: 40.0000 mg | DELAYED_RELEASE_TABLET | Freq: Two times a day (BID) | ORAL | Status: DC

## 2013-06-08 MED ORDER — ATORVASTATIN CALCIUM 40 MG PO TABS: 40.0000 mg | ORAL_TABLET | Freq: Every day | ORAL | Status: DC

## 2013-06-08 NOTE — Telephone Encounter (Signed)
Pt request refill of pantoprazole (PROTONIX) 40 MG tablet AND atorvastatin (LIPITOR) 40 MG tablet Pt has made appt for cpx in march, However, pt is leaving for out of town tonight on a plane and need asap.did not realize had no refills. Can he have enough to get to his appt?? [facility]

## 2013-06-08 NOTE — Telephone Encounter (Signed)
Pt does have appointment scheduled, I sent in both scripts e-scribe and left a message for pt.

## 2013-06-20 ENCOUNTER — Other Ambulatory Visit: Payer: BC Managed Care – PPO

## 2013-06-21 ENCOUNTER — Other Ambulatory Visit (INDEPENDENT_AMBULATORY_CARE_PROVIDER_SITE_OTHER): Payer: BC Managed Care – PPO

## 2013-06-21 DIAGNOSIS — Z0000 Encounter for general adult medical examination without abnormal findings: Secondary | ICD-10-CM

## 2013-06-21 LAB — POCT URINALYSIS DIPSTICK
Bilirubin, UA: NEGATIVE
Blood, UA: NEGATIVE
Glucose, UA: NEGATIVE
Ketones, UA: 1.025
Leukocytes, UA: NEGATIVE
Nitrite, UA: NEGATIVE
SPEC GRAV UA: 1.025
UROBILINOGEN UA: 0.2
pH, UA: 6

## 2013-06-21 LAB — CBC WITH DIFFERENTIAL/PLATELET
Basophils Absolute: 0 10*3/uL (ref 0.0–0.1)
Basophils Relative: 0.4 % (ref 0.0–3.0)
EOS ABS: 0.2 10*3/uL (ref 0.0–0.7)
Eosinophils Relative: 2.6 % (ref 0.0–5.0)
HCT: 46.7 % (ref 39.0–52.0)
Hemoglobin: 15.7 g/dL (ref 13.0–17.0)
Lymphocytes Relative: 32.3 % (ref 12.0–46.0)
Lymphs Abs: 1.9 10*3/uL (ref 0.7–4.0)
MCHC: 33.5 g/dL (ref 30.0–36.0)
MCV: 86.4 fl (ref 78.0–100.0)
MONO ABS: 0.3 10*3/uL (ref 0.1–1.0)
Monocytes Relative: 5.1 % (ref 3.0–12.0)
NEUTROS PCT: 59.6 % (ref 43.0–77.0)
Neutro Abs: 3.5 10*3/uL (ref 1.4–7.7)
PLATELETS: 155 10*3/uL (ref 150.0–400.0)
RBC: 5.41 Mil/uL (ref 4.22–5.81)
RDW: 13.4 % (ref 11.5–14.6)
WBC: 5.9 10*3/uL (ref 4.5–10.5)

## 2013-06-21 LAB — HEPATIC FUNCTION PANEL
ALT: 34 U/L (ref 0–53)
AST: 27 U/L (ref 0–37)
Albumin: 4.7 g/dL (ref 3.5–5.2)
Alkaline Phosphatase: 67 U/L (ref 39–117)
BILIRUBIN DIRECT: 0.1 mg/dL (ref 0.0–0.3)
BILIRUBIN TOTAL: 0.9 mg/dL (ref 0.3–1.2)
Total Protein: 7.7 g/dL (ref 6.0–8.3)

## 2013-06-21 LAB — BASIC METABOLIC PANEL
BUN: 18 mg/dL (ref 6–23)
CO2: 30 mEq/L (ref 19–32)
Calcium: 9.9 mg/dL (ref 8.4–10.5)
Chloride: 104 mEq/L (ref 96–112)
Creatinine, Ser: 0.9 mg/dL (ref 0.4–1.5)
GFR: 100.93 mL/min (ref >60.00)
Glucose, Bld: 107 mg/dL — ABNORMAL HIGH (ref 70–99)
POTASSIUM: 4.9 meq/L (ref 3.5–5.1)
SODIUM: 141 meq/L (ref 135–145)

## 2013-06-21 LAB — TSH: TSH: 0.46 u[IU]/mL (ref 0.35–5.50)

## 2013-06-21 LAB — LIPID PANEL
Cholesterol: 212 mg/dL — ABNORMAL HIGH (ref 0–200)
HDL: 42.8 mg/dL (ref >39.00)
TRIGLYCERIDES: 301 mg/dL — AB (ref 0.0–149.0)
Total CHOL/HDL Ratio: 5
VLDL: 60.2 mg/dL — ABNORMAL HIGH (ref 0.0–40.0)

## 2013-06-21 LAB — LDL CHOLESTEROL, DIRECT: LDL DIRECT: 118.2 mg/dL

## 2013-06-21 LAB — PSA: PSA: 0.79 ng/mL (ref 0.10–4.00)

## 2013-06-27 ENCOUNTER — Encounter: Payer: Self-pay | Admitting: Family Medicine

## 2013-06-27 ENCOUNTER — Ambulatory Visit (INDEPENDENT_AMBULATORY_CARE_PROVIDER_SITE_OTHER): Payer: BC Managed Care – PPO | Admitting: Family Medicine

## 2013-06-27 ENCOUNTER — Telehealth: Payer: Self-pay | Admitting: Family Medicine

## 2013-06-27 VITALS — BP 116/80 | HR 90 | Temp 98.2°F | Ht 70.0 in | Wt 250.0 lb

## 2013-06-27 DIAGNOSIS — Z0000 Encounter for general adult medical examination without abnormal findings: Secondary | ICD-10-CM

## 2013-06-27 DIAGNOSIS — L989 Disorder of the skin and subcutaneous tissue, unspecified: Secondary | ICD-10-CM

## 2013-06-27 MED ORDER — PANTOPRAZOLE SODIUM 40 MG PO TBEC: 40.0000 mg | DELAYED_RELEASE_TABLET | Freq: Two times a day (BID) | ORAL | Status: DC

## 2013-06-27 MED ORDER — ATORVASTATIN CALCIUM 40 MG PO TABS: 40.0000 mg | ORAL_TABLET | Freq: Every day | ORAL | Status: DC

## 2013-06-27 MED ORDER — FENOFIBRATE 145 MG PO TABS: 145.0000 mg | ORAL_TABLET | Freq: Every day | ORAL | Status: DC

## 2013-06-27 NOTE — Progress Notes (Signed)
   Subjective:    Patient ID: [name] [name], male    DOB: 06-10-1960, 53 y.o.   MRN: [id number]  HPI 53 yr old male for a cpx. He feels well but he asks me to check a lesion on the right upper arm. This has been present for 2 years but recently it has grown larger, the color has turned darker, and it now itches.    Review of Systems  Constitutional: Negative.   HENT: Negative.   Eyes: Negative.   Respiratory: Negative.   Cardiovascular: Negative.   Gastrointestinal: Negative.   Genitourinary: Negative.   Musculoskeletal: Negative.   Skin: Negative.   Neurological: Negative.   Psychiatric/Behavioral: Negative.        Objective:   Physical Exam  Constitutional: He is oriented to person, place, and time. He appears well-developed and well-nourished. No distress.  HENT:  Head: Normocephalic and atraumatic.  Right Ear: External ear normal.  Left Ear: External ear normal.  Nose: Nose normal.  Mouth/Throat: Oropharynx is clear and moist. No oropharyngeal exudate.  Eyes: Conjunctivae and EOM are normal. Pupils are equal, round, and reactive to light. Right eye exhibits no discharge. Left eye exhibits no discharge. No scleral icterus.  Neck: Neck supple. No JVD present. No tracheal deviation present. No thyromegaly present.  Cardiovascular: Normal rate, regular rhythm, normal heart sounds and intact distal pulses.  Exam reveals no gallop and no friction rub.   No murmur heard. Pulmonary/Chest: Effort normal and breath sounds normal. No respiratory distress. He has no wheezes. He has no rales. He exhibits no tenderness.  Abdominal: Soft. Bowel sounds are normal. He exhibits no distension and no mass. There is no tenderness. There is no rebound and no guarding.  Genitourinary: Rectum normal, prostate normal and penis normal. Guaiac negative stool. No penile tenderness.  Musculoskeletal: Normal range of motion. He exhibits no edema and no tenderness.  Lymphadenopathy:    He has no  cervical adenopathy.  Neurological: He is alert and oriented to person, place, and time. He has normal reflexes. No cranial nerve deficit. He exhibits normal muscle tone. Coordination normal.  Skin: Skin is warm and dry. No rash noted. He is not diaphoretic. No erythema. No pallor.  The right upper arm has a 4mm nodular bluish black lesion  Psychiatric: He has a normal mood and affect. His behavior is normal. Judgment and thought content normal.          Assessment & Plan:  Well exam. We will add Fenofibrate to get the TG down. [facility] to [facility] the skin lesion.

## 2013-06-27 NOTE — Progress Notes (Signed)
Pre visit review using our clinic review tool, if applicable. No additional management support is needed unless otherwise documented below in the visit note. 

## 2013-06-27 NOTE — Telephone Encounter (Signed)
Relevant patient education mailed to patient.  

## 2013-11-20 ENCOUNTER — Other Ambulatory Visit: Payer: Self-pay | Admitting: Family Medicine

## 2014-02-19 ENCOUNTER — Encounter (HOSPITAL_COMMUNITY): Admission: EM | Disposition: A | Discharge status: Home / Self Care | Payer: Self-pay | Source: Home / Self Care

## 2014-02-19 ENCOUNTER — Encounter: Payer: Self-pay | Admitting: Family Medicine

## 2014-02-19 ENCOUNTER — Ambulatory Visit (INDEPENDENT_AMBULATORY_CARE_PROVIDER_SITE_OTHER)
Admission: RE | Admit: 2014-02-19 | Discharge: 2014-02-19 | Disposition: A | Discharge status: Home / Self Care | Payer: BC Managed Care – PPO | Source: Ambulatory Visit | Attending: Cardiovascular Disease | Admitting: Cardiovascular Disease

## 2014-02-19 ENCOUNTER — Ambulatory Visit (INDEPENDENT_AMBULATORY_CARE_PROVIDER_SITE_OTHER): Payer: BC Managed Care – PPO | Admitting: Family Medicine

## 2014-02-19 ENCOUNTER — Encounter (HOSPITAL_COMMUNITY): Payer: BC Managed Care – PPO | Admitting: Anesthesiology

## 2014-02-19 ENCOUNTER — Encounter (HOSPITAL_COMMUNITY): Payer: Self-pay | Admitting: Emergency Medicine

## 2014-02-19 ENCOUNTER — Emergency Department (HOSPITAL_COMMUNITY): Payer: BC Managed Care – PPO | Admitting: Anesthesiology

## 2014-02-19 ENCOUNTER — Ambulatory Visit (HOSPITAL_COMMUNITY)
Admission: EM | Admit: 2014-02-19 | Discharge: 2014-02-20 | Disposition: A | Discharge status: Home / Self Care | Payer: BC Managed Care – PPO | Attending: General Surgery | Admitting: General Surgery

## 2014-02-19 VITALS — BP 132/85 | HR 75 | Temp 98.1°F | Ht 70.0 in | Wt 261.0 lb

## 2014-02-19 DIAGNOSIS — K6389 Other specified diseases of intestine: Secondary | ICD-10-CM | POA: Diagnosis present

## 2014-02-19 DIAGNOSIS — F17290 Nicotine dependence, other tobacco product, uncomplicated: Secondary | ICD-10-CM | POA: Diagnosis not present

## 2014-02-19 DIAGNOSIS — K3589 Other acute appendicitis: Secondary | ICD-10-CM | POA: Insufficient documentation

## 2014-02-19 DIAGNOSIS — R1031 Right lower quadrant pain: Secondary | ICD-10-CM

## 2014-02-19 DIAGNOSIS — E785 Hyperlipidemia, unspecified: Secondary | ICD-10-CM | POA: Insufficient documentation

## 2014-02-19 DIAGNOSIS — K529 Noninfective gastroenteritis and colitis, unspecified: Secondary | ICD-10-CM

## 2014-02-19 DIAGNOSIS — K219 Gastro-esophageal reflux disease without esophagitis: Secondary | ICD-10-CM | POA: Insufficient documentation

## 2014-02-19 HISTORY — PX: LAPAROSCOPIC APPENDECTOMY: SHX408

## 2014-02-19 LAB — BASIC METABOLIC PANEL
ANION GAP: 15 (ref 5–15)
BUN: 16 mg/dL (ref 6–23)
CHLORIDE: 103 meq/L (ref 96–112)
CO2: 23 mEq/L (ref 19–32)
Calcium: 9.8 mg/dL (ref 8.4–10.5)
Creatinine, Ser: 1.01 mg/dL (ref 0.50–1.35)
GFR calc non Af Amer: 84 mL/min — ABNORMAL LOW (ref >90)
Glucose, Bld: 86 mg/dL (ref 70–99)
POTASSIUM: 3.8 meq/L (ref 3.7–5.3)
SODIUM: 141 meq/L (ref 137–147)

## 2014-02-19 LAB — URINALYSIS, ROUTINE W REFLEX MICROSCOPIC
Bilirubin Urine: NEGATIVE
Glucose, UA: NEGATIVE mg/dL
Hgb urine dipstick: NEGATIVE
Ketones, ur: NEGATIVE mg/dL
LEUKOCYTES UA: NEGATIVE
NITRITE: NEGATIVE
PH: 5 (ref 5.0–8.0)
Protein, ur: NEGATIVE mg/dL
SPECIFIC GRAVITY, URINE: 1.01 (ref 1.005–1.030)
UROBILINOGEN UA: 0.2 mg/dL (ref 0.0–1.0)

## 2014-02-19 LAB — CBC WITH DIFFERENTIAL/PLATELET
BASOS ABS: 0 10*3/uL (ref 0.0–0.1)
BASOS PCT: 0 % (ref 0–1)
EOS ABS: 0.1 10*3/uL (ref 0.0–0.7)
Eosinophils Relative: 3 % (ref 0–5)
HCT: 43.6 % (ref 39.0–52.0)
HEMOGLOBIN: 15.2 g/dL (ref 13.0–17.0)
Lymphocytes Relative: 35 % (ref 12–46)
Lymphs Abs: 2 10*3/uL (ref 0.7–4.0)
MCH: 28.9 pg (ref 26.0–34.0)
MCHC: 34.9 g/dL (ref 30.0–36.0)
MCV: 82.9 fL (ref 78.0–100.0)
MONOS PCT: 7 % (ref 3–12)
Monocytes Absolute: 0.4 10*3/uL (ref 0.1–1.0)
NEUTROS ABS: 3.2 10*3/uL (ref 1.7–7.7)
NEUTROS PCT: 55 % (ref 43–77)
PLATELETS: 171 10*3/uL (ref 150–400)
RBC: 5.26 MIL/uL (ref 4.22–5.81)
RDW: 13.2 % (ref 11.5–15.5)
WBC: 5.7 10*3/uL (ref 4.0–10.5)

## 2014-02-19 SURGERY — APPENDECTOMY, LAPAROSCOPIC
Anesthesia: General | Site: Abdomen

## 2014-02-19 MED ORDER — ONDANSETRON HCL 4 MG/2ML IJ SOLN: 4.0000 mg | Freq: Once | INTRAMUSCULAR | Status: DC | PRN

## 2014-02-19 MED ORDER — KCL IN DEXTROSE-NACL 20-5-0.45 MEQ/L-%-% IV SOLN
INTRAVENOUS | Status: DC
  Administered 2014-02-19 – 2014-02-20 (×2): via INTRAVENOUS
  Filled 2014-02-19 (×5): qty 1000

## 2014-02-19 MED ORDER — GLYCOPYRROLATE 0.2 MG/ML IJ SOLN
INTRAMUSCULAR | Status: AC
  Filled 2014-02-19: qty 3

## 2014-02-19 MED ORDER — KCL IN DEXTROSE-NACL 20-5-0.45 MEQ/L-%-% IV SOLN
INTRAVENOUS | Status: AC
  Filled 2014-02-19: qty 1000

## 2014-02-19 MED ORDER — FENTANYL CITRATE 0.05 MG/ML IJ SOLN
INTRAMUSCULAR | Status: AC
  Filled 2014-02-19: qty 5

## 2014-02-19 MED ORDER — LIDOCAINE HCL 1 % IJ SOLN
INTRAMUSCULAR | Status: DC | PRN
  Administered 2014-02-19: 21:00:00 via INTRAMUSCULAR

## 2014-02-19 MED ORDER — ROCURONIUM BROMIDE 100 MG/10ML IV SOLN
INTRAVENOUS | Status: DC | PRN
  Administered 2014-02-19: 40 mg via INTRAVENOUS

## 2014-02-19 MED ORDER — HYDROMORPHONE HCL 1 MG/ML IJ SOLN
INTRAMUSCULAR | Status: AC
  Filled 2014-02-19: qty 1

## 2014-02-19 MED ORDER — GLYCOPYRROLATE 0.2 MG/ML IJ SOLN
INTRAMUSCULAR | Status: DC | PRN
  Administered 2014-02-19: 0.6 mg via INTRAVENOUS

## 2014-02-19 MED ORDER — PROPOFOL 10 MG/ML IV BOLUS
INTRAVENOUS | Status: DC | PRN
  Administered 2014-02-19: 200 mg via INTRAVENOUS

## 2014-02-19 MED ORDER — MIDAZOLAM HCL 2 MG/2ML IJ SOLN
INTRAMUSCULAR | Status: AC
  Filled 2014-02-19: qty 2

## 2014-02-19 MED ORDER — LACTATED RINGERS IV SOLN
INTRAVENOUS | Status: DC | PRN
  Administered 2014-02-19: 21:00:00 via INTRAVENOUS

## 2014-02-19 MED ORDER — KETOROLAC TROMETHAMINE 15 MG/ML IJ SOLN
INTRAMUSCULAR | Status: AC
  Administered 2014-02-19: 15 mg via INTRAVENOUS
  Filled 2014-02-19: qty 1

## 2014-02-19 MED ORDER — ONDANSETRON HCL 4 MG/2ML IJ SOLN
INTRAMUSCULAR | Status: DC | PRN
  Administered 2014-02-19: 4 mg via INTRAVENOUS

## 2014-02-19 MED ORDER — ONDANSETRON HCL 4 MG/2ML IJ SOLN
INTRAMUSCULAR | Status: AC
  Filled 2014-02-19: qty 2

## 2014-02-19 MED ORDER — HYDROMORPHONE HCL 1 MG/ML IJ SOLN
0.2500 mg | INTRAMUSCULAR | Status: DC | PRN
  Administered 2014-02-19 (×3): 0.5 mg via INTRAVENOUS

## 2014-02-19 MED ORDER — MIDAZOLAM HCL 2 MG/2ML IJ SOLN
INTRAMUSCULAR | Status: DC | PRN
  Administered 2014-02-19: 2 mg via INTRAVENOUS

## 2014-02-19 MED ORDER — PIPERACILLIN-TAZOBACTAM 3.375 G IVPB
3.3750 g | INTRAVENOUS | Status: AC
  Administered 2014-02-19: 3.375 g via INTRAVENOUS
  Filled 2014-02-19: qty 50

## 2014-02-19 MED ORDER — KETOROLAC TROMETHAMINE 15 MG/ML IJ SOLN
15.0000 mg | Freq: Four times a day (QID) | INTRAMUSCULAR | Status: DC
  Administered 2014-02-19 – 2014-02-20 (×2): 15 mg via INTRAVENOUS
  Filled 2014-02-19 (×5): qty 1

## 2014-02-19 MED ORDER — PROPOFOL 10 MG/ML IV BOLUS
INTRAVENOUS | Status: AC
  Filled 2014-02-19: qty 20

## 2014-02-19 MED ORDER — BUPIVACAINE-EPINEPHRINE (PF) 0.25% -1:200000 IJ SOLN
INTRAMUSCULAR | Status: AC
  Filled 2014-02-19: qty 30

## 2014-02-19 MED ORDER — DEXTROSE 5 % IV SOLN
1.0000 g | Freq: Four times a day (QID) | INTRAVENOUS | Status: DC
  Administered 2014-02-19 – 2014-02-20 (×2): 1 g via INTRAVENOUS
  Filled 2014-02-19 (×4): qty 1

## 2014-02-19 MED ORDER — LIDOCAINE HCL (CARDIAC) 20 MG/ML IV SOLN
INTRAVENOUS | Status: DC | PRN
  Administered 2014-02-19: 50 mg via INTRAVENOUS

## 2014-02-19 MED ORDER — OXYCODONE-ACETAMINOPHEN 5-325 MG PO TABS
1.0000 | ORAL_TABLET | ORAL | Status: DC | PRN
  Administered 2014-02-20: 1 via ORAL
  Filled 2014-02-19: qty 1

## 2014-02-19 MED ORDER — NEOSTIGMINE METHYLSULFATE 10 MG/10ML IV SOLN
INTRAVENOUS | Status: DC | PRN
  Administered 2014-02-19: 4 mg via INTRAVENOUS

## 2014-02-19 MED ORDER — DEXTROSE 5 % IV SOLN
INTRAVENOUS | Status: AC
  Filled 2014-02-19: qty 1

## 2014-02-19 MED ORDER — FENTANYL CITRATE 0.05 MG/ML IJ SOLN
INTRAMUSCULAR | Status: DC | PRN
  Administered 2014-02-19 (×2): 100 ug via INTRAVENOUS

## 2014-02-19 MED ORDER — SUCCINYLCHOLINE CHLORIDE 20 MG/ML IJ SOLN
INTRAMUSCULAR | Status: DC | PRN
  Administered 2014-02-19: 100 mg via INTRAVENOUS

## 2014-02-19 MED ORDER — KETOROLAC TROMETHAMINE 15 MG/ML IJ SOLN
15.0000 mg | Freq: Four times a day (QID) | INTRAMUSCULAR | Status: DC | PRN
  Filled 2014-02-19: qty 1

## 2014-02-19 MED ORDER — LIDOCAINE HCL (PF) 1 % IJ SOLN
INTRAMUSCULAR | Status: AC
  Filled 2014-02-19: qty 30

## 2014-02-19 SURGICAL SUPPLY — 49 items
ADH SKN CLS APL DERMABOND .7 (GAUZE/BANDAGES/DRESSINGS) ×1
APPLIER CLIP ROT 10 11.4 M/L (STAPLE)
APR CLP MED LRG 11.4X10 (STAPLE)
BAG SPEC RTRVL LRG 6X4 10 (ENDOMECHANICALS) ×1
BLADE SURG ROTATE 9660 (MISCELLANEOUS) ×2 IMPLANT
CANISTER SUCTION 2500CC (MISCELLANEOUS) ×3 IMPLANT
CHLORAPREP W/TINT 26ML (MISCELLANEOUS) ×3 IMPLANT
CLIP APPLIE ROT 10 11.4 M/L (STAPLE) IMPLANT
COVER SURGICAL LIGHT HANDLE (MISCELLANEOUS) ×3 IMPLANT
CUTTER FLEX LINEAR 45M (STAPLE) ×3 IMPLANT
DERMABOND ADVANCED (GAUZE/BANDAGES/DRESSINGS) ×2
DERMABOND ADVANCED .7 DNX12 (GAUZE/BANDAGES/DRESSINGS) ×1 IMPLANT
DRAPE LAPAROSCOPIC ABDOMINAL (DRAPES) ×3 IMPLANT
DRAPE UTILITY 15X26 W/TAPE STR (DRAPE) ×6 IMPLANT
DRAPE WARM FLUID 44X44 (DRAPE) ×3 IMPLANT
ELECT REM PT RETURN 9FT ADLT (ELECTROSURGICAL) ×3
ELECTRODE REM PT RTRN 9FT ADLT (ELECTROSURGICAL) ×1 IMPLANT
ENDOLOOP SUT PDS II  0 18 (SUTURE)
ENDOLOOP SUT PDS II 0 18 (SUTURE) IMPLANT
GLOVE BIO SURGEON STRL SZ 6 (GLOVE) ×3 IMPLANT
GLOVE BIOGEL PI IND STRL 6.5 (GLOVE) ×1 IMPLANT
GLOVE BIOGEL PI IND STRL 7.0 (GLOVE) IMPLANT
GLOVE BIOGEL PI IND STRL 8 (GLOVE) IMPLANT
GLOVE BIOGEL PI INDICATOR 6.5 (GLOVE) ×2
GLOVE BIOGEL PI INDICATOR 7.0 (GLOVE) ×2
GLOVE BIOGEL PI INDICATOR 8 (GLOVE) ×4
GLOVE SURG SS PI 7.0 STRL IVOR (GLOVE) ×2 IMPLANT
GOWN STRL REUS W/ TWL LRG LVL3 (GOWN DISPOSABLE) ×2 IMPLANT
GOWN STRL REUS W/TWL 2XL LVL3 (GOWN DISPOSABLE) ×3 IMPLANT
GOWN STRL REUS W/TWL LRG LVL3 (GOWN DISPOSABLE) ×6
KIT BASIN OR (CUSTOM PROCEDURE TRAY) ×3 IMPLANT
KIT ROOM TURNOVER OR (KITS) ×3 IMPLANT
NS IRRIG 1000ML POUR BTL (IV SOLUTION) ×3 IMPLANT
PAD ARMBOARD 7.5X6 YLW CONV (MISCELLANEOUS) ×6 IMPLANT
POUCH SPECIMEN RETRIEVAL 10MM (ENDOMECHANICALS) ×3 IMPLANT
RELOAD STAPLE 45 3.5 BLU ETS (ENDOMECHANICALS) ×1 IMPLANT
RELOAD STAPLE TA45 3.5 REG BLU (ENDOMECHANICALS) ×9 IMPLANT
SCALPEL HARMONIC ACE (MISCELLANEOUS) ×3 IMPLANT
SET IRRIG TUBING LAPAROSCOPIC (IRRIGATION / IRRIGATOR) ×3 IMPLANT
SLEEVE ENDOPATH XCEL 5M (ENDOMECHANICALS) ×3 IMPLANT
SPECIMEN JAR SMALL (MISCELLANEOUS) ×3 IMPLANT
SUT MNCRL AB 4-0 PS2 18 (SUTURE) ×3 IMPLANT
TOWEL OR 17X24 6PK STRL BLUE (TOWEL DISPOSABLE) ×3 IMPLANT
TOWEL OR 17X26 10 PK STRL BLUE (TOWEL DISPOSABLE) ×3 IMPLANT
TRAY FOLEY CATH 16FR SILVER (SET/KITS/TRAYS/PACK) ×3 IMPLANT
TRAY LAPAROSCOPIC (CUSTOM PROCEDURE TRAY) ×3 IMPLANT
TROCAR XCEL BLUNT TIP 100MML (ENDOMECHANICALS) ×3 IMPLANT
TROCAR XCEL NON-BLD 5MMX100MML (ENDOMECHANICALS) ×3 IMPLANT
TUBING INSUFFLATION (TUBING) ×3 IMPLANT

## 2014-02-19 NOTE — Progress Notes (Signed)
   Subjective:    Patient ID: [name] GrillsMichael W Harten, male    DOB: Aug 02, 1960, 53 y.o.   MRN: [id number]  HPI Here for 48 hours of severe sharp pains in the RLQ of the abdomen. No fever and no nausea. He has had several loose BMs a day. This started with some generalized abdominal cramps about 4 days ago after eating a seafood meal on a trip to [place]. Apparently several people became ill after eating the same meal.    Review of Systems  Constitutional: Negative.   Respiratory: Negative.   Cardiovascular: Negative.   Gastrointestinal: Positive for abdominal pain, diarrhea and abdominal distention. Negative for nausea, vomiting, constipation, blood in stool and rectal pain.       Objective:   Physical Exam  Constitutional: He appears well-developed and well-nourished. No distress.  Cardiovascular: Normal rate, regular rhythm, normal heart sounds and intact distal pulses.   Pulmonary/Chest: Effort normal and breath sounds normal.  Abdominal: Soft. Bowel sounds are normal. He exhibits no distension and no mass. There is no guarding.  He is very tender in the RLQ with positive rebound          Assessment & Plan:  This is possible appendicitis so we will set up a stat CT of the abdomen and pelvis.

## 2014-02-19 NOTE — Anesthesia Preprocedure Evaluation (Addendum)
Anesthesia Evaluation  Patient identified by MRN, date of birth, ID band Patient awake    Reviewed: Allergy & Precautions, H&P , NPO status   Airway Mallampati: I  TM Distance: >3 FB Neck ROM: Full    Dental  (+) Teeth Intact, Dental Advisory Given,    Pulmonary Current Smoker,  breath sounds clear to auscultation        Cardiovascular Rhythm:Regular Rate:Normal     Neuro/Psych    GI/Hepatic   Endo/Other    Renal/GU      Musculoskeletal   Abdominal   Peds  Hematology   Anesthesia Other Findings   Reproductive/Obstetrics                            Anesthesia Physical Anesthesia Plan  ASA: II and emergent  Anesthesia Plan: General   Post-op Pain Management:    Induction: Intravenous, Rapid sequence and Cricoid pressure planned  Airway Management Planned: Oral ETT  Additional Equipment:   Intra-op Plan:   Post-operative Plan: Extubation in OR  Informed Consent:   Dental advisory given  Plan Discussed with: Anesthesiologist, Surgeon and CRNA  Anesthesia Plan Comments:        Anesthesia Quick Evaluation

## 2014-02-19 NOTE — ED Notes (Signed)
General surgery at bedside. 

## 2014-02-19 NOTE — Op Note (Signed)
Appendectomy, Lap, Procedure Note  Indications: The patient presented with a history of right-sided abdominal pain. A CT revealed findings consistent with appendicitis epiploica vs tip appendicitis.  Because of his worsening severe RLQ pain, he was taken for diagnostic laparoscopy  Pre-operative Diagnosis: Acute appendicitis without mention of peritonitis  Post-operative Diagnosis: epiploic appendicitis adherent to appendiceal tip  [facility] He[facility]   Anesthesia: General endotracheal anesthesia and Local anesthesia 1% plain lidocaine, 0.25.% bupivacaine, with epinephrine  ASA Class: 2 E  Procedure Details  The patient was seen again in the Holding Room. The risks, benefits, complications, treatment options, and expected outcomes were discussed with the patient and/or family. The possibilities of perforation of viscus, bleeding, recurrent infection, the need for additional procedures, failure to diagnose a condition, and creating a complication requiring transfusion or operation were discussed. There was concurrence with the proposed plan and informed consent was obtained. The site of surgery was properly noted. The patient was taken to Operating Room, identified as [name] [name] and the procedure verified as Appendectomy. A Time Out was held and the above information confirmed.  The patient was placed in the supine position and general anesthesia was induced, along with placement of orogastric tube, Venodyne boots, and a Foley catheter. The abdomen was prepped and draped in a sterile fashion. Local anesthetic was infiltrated in the infraumbilical region.  A 1.5 cm curvilinear transverse incision was made just below the umbilicus.  The Kelly clamp was used to spread the subcutaneous tissues.  The fascia was elevated with 2 Kocher clamps and incised with the #11 blade.  A Tresa EndoKelly was used to confirm entrance into the peritoneal cavity.  A pursestring suture was placed around the fascial incision.   The Hasson trocar was inserted into the abdomen and held in place with the tails of the suture.  The pneumoperitoneum was then established to steady pressure of 15 mmHg.     Additional 5 mm cannulas then placed in the left lower quadrant of the abdomen and the suprapubic region under direct visualization.  A careful evaluation of the entire abdomen was carried out. The patient was placed in Trendelenburg and rotated to the left.  The small intestines were retracted in the cephalad and left lateral direction away from the pelvis and right lower quadrant. The patient was found to have an necrotic epiploic appendix adherent to the tip of the appendix.  The appendix was carefully dissected. The appendix was was skeletonized with the harmonic scalpel.  The adherent necrotic epiploic fat was taken en bloc with the tip of the appendix. The appendix was divided at its base using an endo-GIA stapler. Minimal appendiceal stump was left in place. The appendix was removed from the abdomen with an Endocatch bag through the left subcostal port.  There was no evidence of bleeding, leakage, or complication after division of the appendix. Irrigation was also performed and irrigate suctioned from the abdomen as well.  The 5 mm trocars were removed.  The pneumoperitoneum was evacuated from the abdomen.    The trocar site skin wounds were closed with 4-0 Monocryl and dressed with Dermabond.  Instrument, sponge, and needle counts were correct at the conclusion of the case.   Findings: The appendix was found to be adherent to a necrotic epiploic appendix.  There were not signs of necrosis.  There was not perforation. There was not abscess formation.  Estimated Blood Loss:  Minimal           Specimens: appendix with necrotic  fat         Complications:  None; patient tolerated the procedure well.         Disposition: PACU - hemodynamically stable.         Condition: stable

## 2014-02-19 NOTE — ED Notes (Signed)
Consent for laparoscopic appendectomy signed by Dr. Donell BeersByerly and the patient

## 2014-02-19 NOTE — Anesthesia Procedure Notes (Signed)
Procedure Name: Intubation Date/Time: 02/19/2014 9:19 PM Performed by: Molli HazardGORDON, Leroy Pettway M Pre-anesthesia Checklist: Patient identified, Emergency Drugs available, Suction available and Patient being monitored Patient Re-evaluated:Patient Re-evaluated prior to inductionOxygen Delivery Method: Circle system utilized Preoxygenation: Pre-oxygenation with 100% oxygen Intubation Type: IV induction, Rapid sequence and Cricoid Pressure applied Laryngoscope Size: Miller and 2 Grade View: Grade I Tube type: Oral Tube size: 7.5 mm Number of attempts: 1 Airway Equipment and Method: Stylet Placement Confirmation: ETT inserted through vocal cords under direct vision,  positive ETCO2 and breath sounds checked- equal and bilateral Secured at: 23 cm Tube secured with: Tape Dental Injury: Teeth and Oropharynx as per pre-operative assessment

## 2014-02-19 NOTE — ED Notes (Signed)
Pt complaining of RLQ abdominal pain with deep palpation x 48 hrs. Went to PCP this am and was told to come to the ER for a stat CT for possible appendicitis.

## 2014-02-19 NOTE — H&P (Signed)
[name] [name] is an 53 y.o. male.   Chief Complaint: abdominal pain HPI: Pt is a 53 yo M who presented to his PCP for 48 hours of abdominal pain in the periumbilical location which localized to the RLQ.  He describes it as a stabbing pain that goes to the back.  He has some anorexia.  The pain is worse with movement.  He has not had nausea or vomiting, but has had some diarrhea.  He has had diarrhea on and off for the last 5 weeks.  He ate something in [place] and his bowels have not been exactly right since then.  He also had some oysters Thursday and had a lot of bloating since then.  He denies fever/ chills.    Past Medical History  Diagnosis Date  . GERD (gastroesophageal reflux disease)   . Hyperlipidemia     Past Surgical History  Procedure Laterality Date  . Tonsillectomy    . Lumbar disectomy      L4-L5  . Colonoscopy  07-19-07    per Dr. Carlean [name], int. hemorrhoids only, repeat in 10 yrs  . Esophagogastroduodenoscopy  07-19-07    per Dr. Carlean [name], GERD only    Family History  Problem Relation Age of Onset  . Diabetes      family hx  . Breast cancer      family hx  . Heart disease      family hx   Social History:  reports that he has been smoking Cigars.  He has never used smokeless tobacco. He reports that he drinks about 2.4 ounces of alcohol per week. He reports that he does not use illicit drugs.  Allergies: No Known Allergies   (Not in a hospital admission)  Results for orders placed during the hospital encounter of 02/19/14 (from the past 48 hour(s))  URINALYSIS, ROUTINE W REFLEX MICROSCOPIC     Status: None   Collection Time    02/19/14  4:57 PM      Result Value Ref Range   Color, Urine YELLOW  YELLOW   APPearance CLEAR  CLEAR   Specific Gravity, Urine 1.010  1.005 - 1.030   pH 5.0  5.0 - 8.0   Glucose, UA NEGATIVE  NEGATIVE mg/dL   Hgb urine dipstick NEGATIVE  NEGATIVE   Bilirubin Urine NEGATIVE  NEGATIVE   Ketones, ur NEGATIVE  NEGATIVE mg/dL   Protein,  ur NEGATIVE  NEGATIVE mg/dL   Urobilinogen, UA 0.2  0.0 - 1.0 mg/dL   Nitrite NEGATIVE  NEGATIVE   Leukocytes, UA NEGATIVE  NEGATIVE   Comment: MICROSCOPIC NOT DONE ON URINES WITH NEGATIVE PROTEIN, BLOOD, LEUKOCYTES, NITRITE, OR GLUCOSE <1000 mg/dL.  CBC WITH DIFFERENTIAL     Status: None   Collection Time    02/19/14  5:30 PM      Result Value Ref Range   WBC 5.7  4.0 - 10.5 K/uL   RBC 5.26  4.22 - 5.81 MIL/uL   Hemoglobin 15.2  13.0 - 17.0 g/dL   HCT 43.6  39.0 - 52.0 %   MCV 82.9  78.0 - 100.0 fL   MCH 28.9  26.0 - 34.0 pg   MCHC 34.9  30.0 - 36.0 g/dL   RDW 13.2  11.5 - 15.5 %   Platelets 171  150 - 400 K/uL   Neutrophils Relative % 55  43 - 77 %   Neutro Abs 3.2  1.7 - 7.7 K/uL   Lymphocytes Relative 35  12 - 46 %  Lymphs Abs 2.0  0.7 - 4.0 K/uL   Monocytes Relative 7  3 - 12 %   Monocytes Absolute 0.4  0.1 - 1.0 K/uL   Eosinophils Relative 3  0 - 5 %   Eosinophils Absolute 0.1  0.0 - 0.7 K/uL   Basophils Relative 0  0 - 1 %   Basophils Absolute 0.0  0.0 - 0.1 K/uL  BASIC METABOLIC PANEL     Status: Abnormal   Collection Time    02/19/14  5:30 PM      Result Value Ref Range   Sodium 141  137 - 147 mEq/L   Potassium 3.8  3.7 - 5.3 mEq/L   Chloride 103  96 - 112 mEq/L   CO2 23  19 - 32 mEq/L   Glucose, Bld 86  70 - 99 mg/dL   BUN 16  6 - 23 mg/dL   Creatinine, Ser 1.01  0.50 - 1.35 mg/dL   Calcium 9.8  8.4 - 10.5 mg/dL   GFR calc non Af Amer 84 (*) >90 mL/min   GFR calc Af Amer >90  >90 mL/min   Comment: (NOTE)     The eGFR has been calculated using the CKD EPI equation.     This calculation has not been validated in all clinical situations.     eGFR's persistently <90 mL/min signify possible Chronic Kidney     Disease.   Anion gap 15  5 - 15   Ct Abdomen Pelvis W Contrast  02/19/2014   CLINICAL DATA:  Right lower quadrant pain zero 2 days, intermittent fever  EXAM: CT ABDOMEN AND PELVIS WITH CONTRAST  TECHNIQUE: Multidetector CT imaging of the abdomen and  pelvis was performed using the standard protocol following bolus administration of intravenous contrast.  CONTRAST:  100 cc Omnipaque 300  COMPARISON:  CT abdomen pelvis of 06/12/2011  FINDINGS: The lung bases are clear. The liver is low in attenuation consistent with fatty infiltration with areas of sparing near the gallbladder. No focal hepatic abnormality is seen. No calcified gallstones are noted. The pancreas is normal in size and the pancreatic duct is not dilated. The adrenal glands and spleen are unremarkable. The stomach is decompressed. The kidneys enhance with no calculus or mass and no hydronephrosis is seen. The abdominal aorta is normal in caliber. No adenopathy is seen.  The urinary bladder is unremarkable. A small urachal remnant is noted anteriorly. The prostate is normal in size. No fluid is seen within the pelvis. There are a few scattered diverticula within the colon. The colon is largely decompressed. The terminal ileum is unremarkable. The appendix is visualized extending retrocecal and then laterally. However adjacent to the tip of the appendix there is an area of focal inflammation which appears to have a fatty center, most typical of appendicitis epiploica. However due to the close proximity of this small focal inflammatory process to the tip of the appendix, clinical followup is recommended. No definite evidence of acute appendicitis is seen. Degenerative disc disease is noted primarily at L4-5.  IMPRESSION: 1. Focal inflammatory process within the right lower quadrant near the tip of the appendix, but with an appearance most typical of appendicitis epiploica. 2. Due to the close proximity of this focal inflammatory process to the tip of the appendix, close clinical followup is recommended. The appendix does appear to be normal in caliber. 3. Fatty infiltration of the liver with areas of sparing.   Electronically Signed   By: [name] [name].D.  On: 02/19/2014 14:29    Review of Systems   Constitutional: Negative.   HENT: Negative.   Eyes: Negative.   Respiratory: Negative.   Cardiovascular: Negative.   Gastrointestinal: Positive for nausea, abdominal pain and diarrhea.       Bloating  Genitourinary: Negative.   Musculoskeletal: Negative.   Skin: Negative.   Neurological: Negative.   Endo/Heme/Allergies: Negative.   Psychiatric/Behavioral: Negative.     Blood pressure 138/95, pulse 74, temperature 98.2 F (36.8 C), temperature source Oral, resp. rate 18, weight 261 lb 4 oz (118.502 kg), SpO2 94.00%. Physical Exam  Constitutional: He appears well-developed and well-nourished. No distress.  HENT:  Head: Normocephalic and atraumatic.  Eyes: Conjunctivae are normal. Pupils are equal, round, and reactive to light. Right eye exhibits no discharge. Left eye exhibits no discharge. No scleral icterus.  Neck: Normal range of motion. No thyromegaly present.  Cardiovascular: Normal rate and intact distal pulses.  Exam reveals no gallop and no friction rub.   No murmur heard. Respiratory: Effort normal. No respiratory distress. He exhibits no tenderness.  GI: Soft. He exhibits distension. There is tenderness (RLQ). There is guarding (voluntary). There is no rebound.  Musculoskeletal: Normal range of motion. He exhibits no edema and no tenderness.  Neurological: He is alert.  Skin: Skin is warm and dry. No rash noted. He is not diaphoretic. No erythema. No pallor.  Psychiatric: He has a normal mood and affect. His behavior is normal. Judgment and thought content normal.     Assessment/Plan Epiploic appendicitis vs early acute appendicitis. Due to the uncertainty of the location of inflammation, we will plan to take to the OR for diagnostic laparoscopy and appendectomy.    Appendectomy was described to the patient.  The incisions and surgical technique were explained.  The patient was advised that some of the hair on the abdomen would be clipped, and that a foley catheter  would be placed.  I advised the patient of the risks of surgery including, but not limited to, bleeding, infection, damage to other structures, risk of an open operation, risk of abscess, and risk of blood clot.  The recovery was also described to the patient.  He was advised that he will have lifting restrictions for 2 weeks.     Nataki Mccrumb 02/19/2014, 8:11 PM

## 2014-02-19 NOTE — Transfer of Care (Signed)
Immediate Anesthesia Transfer of Care Note  Patient: [name] GrillsMichael W [place]oldsborough  Procedure(s) Performed: Procedure(s): APPENDECTOMY LAPAROSCOPIC (N/A)  Patient Location: PACU  Anesthesia Type:General  Level of Consciousness: awake, alert  and oriented  Airway & Oxygen Therapy: Patient connected to nasal cannula oxygen  Post-op Assessment: Report given to PACU RN, Post -op Vital signs reviewed and stable and Patient moving all extremities X 4  Post vital signs: Reviewed and stable  Complications: No apparent anesthesia complications

## 2014-02-19 NOTE — ED Notes (Signed)
Per pt sts RLQ pain x 48 hours. sts was sent here with CT results appendicitis.

## 2014-02-19 NOTE — ED Provider Notes (Signed)
CSN: [id number]     Arrival date & time 02/19/14  1523 History   First MD Initiated Contact with Patient 02/19/14 1618     Chief Complaint  Patient presents with  . Abdominal Pain     (Consider location/radiation/quality/duration/timing/severity/associated sxs/prior Treatment) HPI Comments: [name] [name] is [name] with a PMHx of hemorrhoids, GERD, and HLD, who presents to the ED with complaints of RLQ abd pain x2 days. He was sent here by Dr. Gershon [name] (PCP at [facility] who saw the pt earlier today and was concerned for appendicitis, and ordered a stat CT which showed appendicitis epiploica. Pt states that 5wks ago he ate shellfish and began having generalized abd pain, which has eased off but 2 days ago he ate oysters and he developed sudden periumbilical abd pain which then traveled down to the RLQ. Pain is 8/10 sharp constant nonradiating worse with movement/walking and palpation, and unchanged with pepto bismol. Associated symptoms include diarrhea, unknown number of episodes since onset, and states they were initially with some bright red blood but that has resolved. Reports that after the pepto bismol, his stools became black but denies that they were tarry. Denies fevers, chills, CP, SOB, cough, URI symptoms, n/v, constipation, obstipation, melena, dysuria, hematuria, rectal pain, myalgias, arthralgias, or weakness. Denies sick contacts. Last ate food last night, and last drink was coffee this morning at 9am. Takes lipitor, tricor, and protonix as directed, and denies any other known medical issues.  Patient is a 53 y.o. male presenting with abdominal pain. The history is provided by the patient. No language interpreter was used.  Abdominal Pain Pain location:  RLQ Pain quality: sharp   Pain radiates to:  Does not radiate Pain severity:  Moderate (8/10) Onset quality:  Sudden Duration:  2 days Timing:  Constant Progression:  Worsening Chronicity:  New Context: suspicious  food intake   Context: not previous surgeries and not sick contacts   Relieved by:  Nothing Worsened by:  Movement and palpation Ineffective treatments:  Antacids (pepto bismol) Associated symptoms: diarrhea   Associated symptoms: no anorexia, no belching, no chest pain, no chills, no constipation, no cough, no dysuria, no fever, no flatus, no hematemesis, no hematochezia, no hematuria, no melena, no nausea, no shortness of breath and no vomiting     Past Medical History  Diagnosis Date  . GERD (gastroesophageal reflux disease)   . Hyperlipidemia    Past Surgical History  Procedure Laterality Date  . Tonsillectomy    . Lumbar disectomy      L4-L5  . Colonoscopy  07-19-07    per Dr. Leone [name], int. hemorrhoids only, repeat in 10 yrs  . Esophagogastroduodenoscopy  07-19-07    per Dr. Leone [name], GERD only   Family History  Problem Relation Age of Onset  . Diabetes      family hx  . Breast cancer      family hx  . Heart disease      family hx   History  Substance Use Topics  . Smoking status: Current Some Day Smoker    Types: Cigars  . Smokeless tobacco: Never Used     Comment: maybe once every 1-2 months or less  . Alcohol Use: 2.4 oz/week    4 Glasses of wine per week    Review of Systems  Constitutional: Negative for fever and chills.  Respiratory: Negative for cough, chest tightness and shortness of breath.   Cardiovascular: Negative for chest pain.  Gastrointestinal: Positive for  abdominal pain, diarrhea and blood in stool (temporarily, now resolved). Negative for nausea, vomiting, constipation, melena, hematochezia, abdominal distention, rectal pain, anorexia, flatus and hematemesis.  Genitourinary: Negative for dysuria, urgency, frequency, hematuria and flank pain.  Musculoskeletal: Negative for arthralgias and myalgias.  Skin: Negative for rash.  Neurological: Negative for dizziness, weakness, light-headedness and headaches.   10 Systems reviewed and are negative  for acute change except as noted in the HPI.    Allergies  Review of patient's allergies indicates no known allergies.  Home Medications   Prior to Admission medications   Medication Sig Start Date End Date Taking? Authorizing Provider  atorvastatin (LIPITOR) 40 MG tablet Take 1 tablet (40 mg total) by mouth daily. 06/27/13   [name] SalisburyStephen A Fry, MD  fenofibrate (TRICOR) 145 MG tablet Take 1 tablet (145 mg total) by mouth daily. 06/27/13   [name] SalisburyStephen A Fry, MD  pantoprazole (PROTONIX) 40 MG tablet TAKE 1 TABLET BY MOUTH TWICE A DAY    [name] SalisburyStephen A Fry, MD   BP 145/86  Pulse 76  Temp(Src) 97.9 F (36.6 C)  Resp 18  Wt 261 lb 4 oz (118.502 kg)  SpO2 95% Physical Exam  Nursing note and vitals reviewed. Constitutional: He is oriented to person, place, and time. Vital signs are normal. He appears well-developed and well-nourished. No distress.  Afebrile, nontoxic  HENT:  Head: Normocephalic and atraumatic.  Mouth/Throat: Oropharynx is clear and moist and mucous membranes are normal.  Eyes: Conjunctivae and EOM are normal. Right eye exhibits no discharge. Left eye exhibits no discharge.  Neck: Normal range of motion. Neck supple.  Cardiovascular: Normal rate, regular rhythm, normal heart sounds and intact distal pulses.  Exam reveals no gallop and no friction rub.   No murmur heard. Pulmonary/Chest: Effort normal and breath sounds normal. No respiratory distress. He has no decreased breath sounds. He has no wheezes. He has no rhonchi. He has no rales.  Abdominal: Soft. Normal appearance and bowel sounds are normal. He exhibits no distension. There is tenderness in the right lower quadrant. There is rebound and tenderness at McBurney's point. There is no rigidity, no guarding and negative Murphy's sign.    Soft, nondistended, +BS throughout, with tenderness in RLQ at mcburney's point and mild rebound tenderness, no rigidity or guarding, neg murphy's sign, neg psoas sign, +foot tap test    Musculoskeletal: Normal range of motion.  Neurological: He is alert and oriented to person, place, and time.  Skin: Skin is warm, dry and intact. No rash noted.  Psychiatric: He has a normal mood and affect.    ED Course  Procedures (including critical care time) Labs Review Labs Reviewed  BASIC METABOLIC PANEL - Abnormal; Notable for the following:    GFR calc non Af Amer 84 (*)    All other components within normal limits  CBC WITH DIFFERENTIAL  URINALYSIS, ROUTINE W REFLEX MICROSCOPIC    Imaging Review Ct Abdomen Pelvis W Contrast  02/19/2014   CLINICAL DATA:  Right lower quadrant pain zero 2 days, intermittent fever  EXAM: CT ABDOMEN AND PELVIS WITH CONTRAST  TECHNIQUE: Multidetector CT imaging of the abdomen and pelvis was performed using the standard protocol following bolus administration of intravenous contrast.  CONTRAST:  100 cc Omnipaque 300  COMPARISON:  CT abdomen pelvis of 06/12/2011  FINDINGS: The lung bases are clear. The liver is low in attenuation consistent with fatty infiltration with areas of sparing near the gallbladder. No focal hepatic abnormality is seen. No calcified gallstones are  noted. The pancreas is normal in size and the pancreatic duct is not dilated. The adrenal glands and spleen are unremarkable. The stomach is decompressed. The kidneys enhance with no calculus or mass and no hydronephrosis is seen. The abdominal aorta is normal in caliber. No adenopathy is seen.  The urinary bladder is unremarkable. A small urachal remnant is noted anteriorly. The prostate is normal in size. No fluid is seen within the pelvis. There are a few scattered diverticula within the colon. The colon is largely decompressed. The terminal ileum is unremarkable. The appendix is visualized extending retrocecal and then laterally. However adjacent to the tip of the appendix there is an area of focal inflammation which appears to have a fatty center, most typical of appendicitis epiploica.  However due to the close proximity of this small focal inflammatory process to the tip of the appendix, clinical followup is recommended. No definite evidence of acute appendicitis is seen. Degenerative disc disease is noted primarily at L4-5.  IMPRESSION: 1. Focal inflammatory process within the right lower quadrant near the tip of the appendix, but with an appearance most typical of appendicitis epiploica. 2. Due to the close proximity of this focal inflammatory process to the tip of the appendix, close clinical followup is recommended. The appendix does appear to be normal in caliber. 3. Fatty infiltration of the liver with areas of sparing.   Electronically Signed   By: Dwyane DeePaul  Barry M.D.   On: 02/19/2014 14:29     EKG Interpretation None      MDM   Final diagnoses:  RLQ abdominal pain    52y/o male with RLQ abd pain and CT demonstrating appendicitis epiploica. Will obtain basic labs, u/a, and consult surgery. Declines pain meds at this time.   5:32 PM [name] [name] returning page, will see pt and eval need for admission. Reports that he will potentially not need surgery at this time but she will see the pt and assess his need for surgical management. U/A WNL. Blood labs pending.  6:19 PM  CBC w/diff WNL. BMP WNL.   8:25 PM [name] [name] here to see pt, admitting for surgical management of his condition. Please see her H&P for ongoing documentation.  BP 138/95  Pulse 74  Temp(Src) 98.2 F (36.8 C) (Oral)  Resp 18  Wt 261 lb 4 oz (118.502 kg)  SpO2 94%   Celanese CorporationMercedes Strupp Camprubi-Soms, PA-C 02/19/14 2027

## 2014-02-20 ENCOUNTER — Encounter (HOSPITAL_COMMUNITY): Payer: Self-pay | Admitting: General Surgery

## 2014-02-20 LAB — CBC
HCT: 41.3 % (ref 39.0–52.0)
Hemoglobin: 13.9 g/dL (ref 13.0–17.0)
MCH: 28.3 pg (ref 26.0–34.0)
MCHC: 33.7 g/dL (ref 30.0–36.0)
MCV: 83.9 fL (ref 78.0–100.0)
PLATELETS: 176 10*3/uL (ref 150–400)
RBC: 4.92 MIL/uL (ref 4.22–5.81)
RDW: 13.2 % (ref 11.5–15.5)
WBC: 10.1 10*3/uL (ref 4.0–10.5)

## 2014-02-20 LAB — BASIC METABOLIC PANEL
ANION GAP: 16 — AB (ref 5–15)
BUN: 19 mg/dL (ref 6–23)
CHLORIDE: 100 meq/L (ref 96–112)
CO2: 22 mEq/L (ref 19–32)
CREATININE: 1.17 mg/dL (ref 0.50–1.35)
Calcium: 9.5 mg/dL (ref 8.4–10.5)
GFR calc Af Amer: 82 mL/min — ABNORMAL LOW (ref >90)
GFR calc non Af Amer: 71 mL/min — ABNORMAL LOW (ref >90)
Glucose, Bld: 141 mg/dL — ABNORMAL HIGH (ref 70–99)
Potassium: 4.2 mEq/L (ref 3.7–5.3)
Sodium: 138 mEq/L (ref 137–147)

## 2014-02-20 LAB — GLUCOSE, CAPILLARY: Glucose-Capillary: 123 mg/dL — ABNORMAL HIGH (ref 70–99)

## 2014-02-20 MED ORDER — FENOFIBRATE 160 MG PO TABS
160.0000 mg | ORAL_TABLET | Freq: Every day | ORAL | Status: DC
  Administered 2014-02-20: 160 mg via ORAL
  Filled 2014-02-20: qty 1

## 2014-02-20 MED ORDER — ONDANSETRON HCL 4 MG PO TABS
4.0000 mg | ORAL_TABLET | Freq: Four times a day (QID) | ORAL | Status: DC | PRN
  Filled 2014-02-20: qty 1

## 2014-02-20 MED ORDER — ATORVASTATIN CALCIUM 40 MG PO TABS
40.0000 mg | ORAL_TABLET | Freq: Every day | ORAL | Status: DC
  Administered 2014-02-20: 40 mg via ORAL
  Filled 2014-02-20: qty 1

## 2014-02-20 MED ORDER — MORPHINE SULFATE 2 MG/ML IJ SOLN: 1.0000 mg | INTRAMUSCULAR | Status: DC | PRN

## 2014-02-20 MED ORDER — OXYCODONE-ACETAMINOPHEN 5-325 MG PO TABS: 1.0000 | ORAL_TABLET | ORAL | Status: AC | PRN

## 2014-02-20 MED ORDER — ONDANSETRON HCL 4 MG/2ML IJ SOLN
4.0000 mg | Freq: Four times a day (QID) | INTRAMUSCULAR | Status: DC | PRN
  Administered 2014-02-20: 4 mg via INTRAVENOUS
  Filled 2014-02-20: qty 2

## 2014-02-20 MED ORDER — PANTOPRAZOLE SODIUM 40 MG PO TBEC
40.0000 mg | DELAYED_RELEASE_TABLET | Freq: Every day | ORAL | Status: DC
  Filled 2014-02-20: qty 1

## 2014-02-20 MED ORDER — ENOXAPARIN SODIUM 40 MG/0.4ML ~~LOC~~ SOLN: 40.0000 mg | SUBCUTANEOUS | Status: DC

## 2014-02-20 NOTE — Anesthesia Postprocedure Evaluation (Signed)
  Anesthesia Post-op Note  Patient: [name] GrillsMichael W [name]  Procedure(s) Performed: Procedure(s): APPENDECTOMY LAPAROSCOPIC (N/A)  Patient Location: PACU  Anesthesia Type:General  Level of Consciousness: awake, alert  and oriented  Airway and Oxygen Therapy: Patient Spontanous Breathing  Post-op Pain: none  Post-op Assessment: Post-op Vital signs reviewed, Patient's Cardiovascular Status Stable, Respiratory Function Stable, Patent Airway, No signs of Nausea or vomiting and Pain level controlled  Post-op Vital Signs: stable  Last Vitals:  Filed Vitals:   02/20/14 0004  BP: 121/80  Pulse: 89  Temp: 36.5 C  Resp: 16    Complications: No apparent anesthesia complications

## 2014-02-20 NOTE — Discharge Summary (Signed)
Patient ID: [name] GrillsMichael W [name] MRN: [id number] DOB/AGE: 1960-11-18 53 y.o.  Admit date: 02/19/2014 Discharge date: 02/20/2014  Procedures: lap appy and resection of epiploic appendage   Consults: None  Reason for Admission: Pt is a 53 yo M who presented to his PCP for 48 hours of abdominal pain in the periumbilical location which localized to the RLQ. He describes it as a stabbing pain that goes to the back. He has some anorexia. The pain is worse with movement. He has not had nausea or vomiting, but has had some diarrhea. He has had diarrhea on and off for the last 5 weeks. He ate something in [place] and his bowels have not been exactly right since then. He also had some oysters Thursday and had a lot of bloating since then. He denies fever/ chills.   Admission Diagnoses:  1. Epiploic appendicitis   Hospital Course: The patient was admitted and taken to the OR where he underwent resection of epiploic appendagitis and his appendix.  He tolerated this well.  On POD 1, his pain was well controlled and he was tolerating a regular diet.  He was stable for dc home.  PE: Abd: soft, appropriately tender, +BS, ND, incisions c/d/i  Discharge Diagnoses:  Active Problems:   Appendicitis epiploica   Discharge Medications:   Medication List         atorvastatin 40 MG tablet  Commonly known as:  LIPITOR  Take 1 tablet (40 mg total) by mouth daily.     fenofibrate 145 MG tablet  Commonly known as:  TRICOR  Take 1 tablet (145 mg total) by mouth daily.     multivitamin with minerals tablet  Take 1 tablet by mouth daily.     oxyCODONE-acetaminophen 5-325 MG per tablet  Commonly known as:  PERCOCET/ROXICET  Take 1-2 tablets by mouth every 4 (four) hours as needed for moderate pain.     pantoprazole 40 MG tablet  Commonly known as:  PROTONIX  TAKE 1 TABLET BY MOUTH TWICE A DAY        Discharge Instructions:     Follow-up Information   Follow up with [facility] On  03/13/2014. (3:30pm, arrive no later than 3:00pm for paperwork)    Contact information:   [address]   [place] [address] [phone]       Signed: Letha CapeOSBORNE,Syreeta Figler E 02/20/2014, 8:20 AM

## 2014-02-20 NOTE — ED Provider Notes (Signed)
Medical screening examination/treatment/procedure(s) were performed by non-physician practitioner and as supervising physician I was immediately available for consultation/collaboration.   EKG Interpretation None        Mirian MoMatthew Sheneika Walstad, MD 02/20/14 [phone]

## 2014-02-20 NOTE — Progress Notes (Signed)
UR Completed Bernadetta Roell Graves-Bigelow, RN,BSN [phone]  

## 2014-02-21 NOTE — Discharge Summary (Signed)
Okay to go home.  [name] [name] O. Jillianne Gamino, III, MD, FACS [phone]--pager [phone]--office [facility]  

## 2014-05-06 ENCOUNTER — Other Ambulatory Visit: Payer: Self-pay | Admitting: Family Medicine

## 2014-05-07 ENCOUNTER — Other Ambulatory Visit: Payer: Self-pay | Admitting: Family Medicine

## 2014-06-20 ENCOUNTER — Other Ambulatory Visit: Payer: Self-pay | Admitting: Family Medicine

## 2014-06-25 ENCOUNTER — Other Ambulatory Visit: Payer: Self-pay | Admitting: Family Medicine

## 2014-09-28 ENCOUNTER — Other Ambulatory Visit: Payer: Self-pay | Admitting: Family Medicine

## 2014-10-17 ENCOUNTER — Other Ambulatory Visit: Payer: Self-pay | Admitting: Family Medicine

## 2014-11-14 ENCOUNTER — Other Ambulatory Visit: Payer: Self-pay

## 2014-11-21 ENCOUNTER — Telehealth: Payer: Self-pay | Admitting: Family Medicine

## 2014-11-21 ENCOUNTER — Encounter: Payer: Self-pay | Admitting: Family Medicine

## 2014-11-21 NOTE — Telephone Encounter (Signed)
Pt was on schedule for CPE this morning to see Dr. Clent Ridges. I called and left a voice message for pt to reschedule.

## 2015-01-10 ENCOUNTER — Other Ambulatory Visit: Payer: Self-pay | Admitting: Family Medicine

## 2015-02-24 ENCOUNTER — Other Ambulatory Visit: Payer: Self-pay | Admitting: Family Medicine

## 2015-02-28 ENCOUNTER — Telehealth: Payer: Self-pay | Admitting: Family Medicine

## 2015-02-28 ENCOUNTER — Other Ambulatory Visit: Payer: Self-pay | Admitting: Family Medicine

## 2015-02-28 NOTE — Telephone Encounter (Signed)
Pt need refill on pantoprazole 40 mg #90 [facility]. Pt needs refill today. [place] is flying [place] in about 7 hrs.

## 2015-03-01 NOTE — Telephone Encounter (Signed)
Script was sent e-scribe 

## 2015-03-15 IMAGING — CT CT ABD-PELV W/ CM
2 of 5 series · 16 of 46 positions shown, 18 images · IV contrast (omnipaque)
Comparison: CT abdomen pelvis of 06/12/2011

CLINICAL DATA: Right lower quadrant pain zero 2 days, intermittent
fever

EXAM:
CT ABDOMEN AND PELVIS WITH CONTRAST
TECHNIQUE: Multidetector CT imaging of the abdomen and pelvis was performed
using the standard protocol following bolus administration of
intravenous contrast.
CONTRAST:  100 cc Omnipaque 300

[Series 2: abd/ pel 5mm · axial · 0.85mm/px · z∈[-541,-116]mm · 13 of 96 slices shown, 15 images]
[im 6/96  soft-tissue]
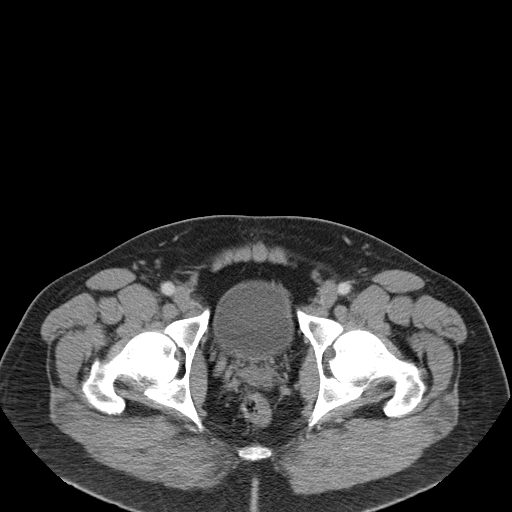
[im 6/96  bone]
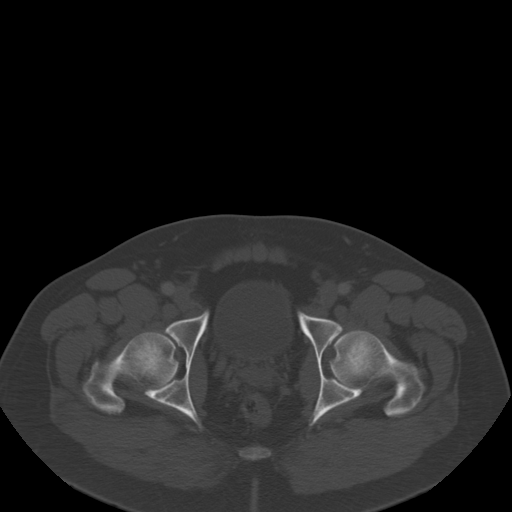
[im 16/96  soft-tissue]
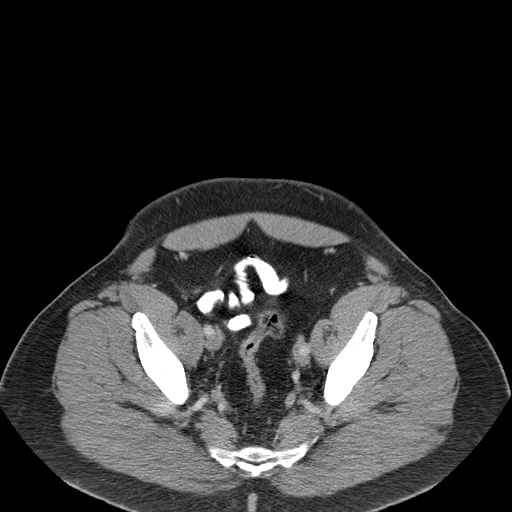
[im 21/96  soft-tissue]
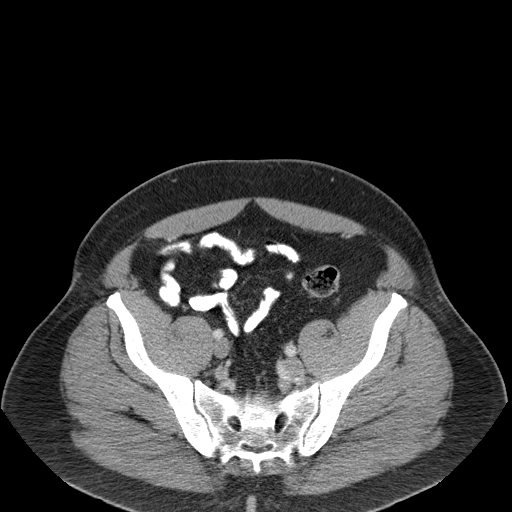
[im 26/96  soft-tissue]
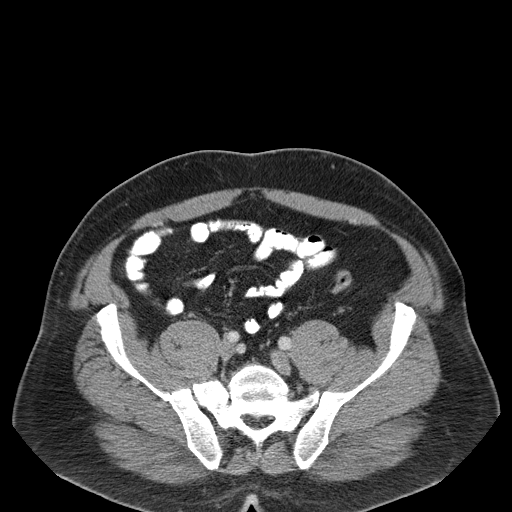
[im 36/96  soft-tissue]
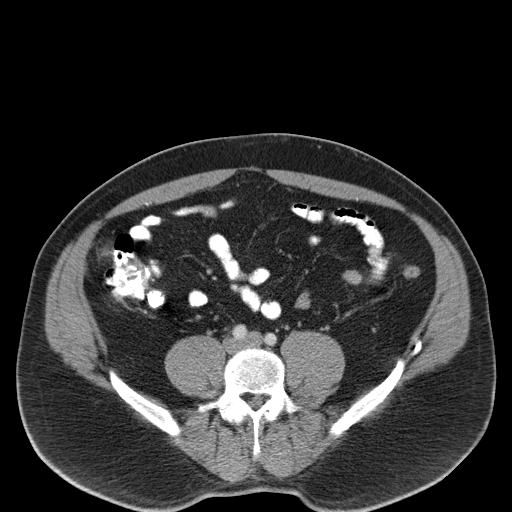
[im 41/96  soft-tissue]
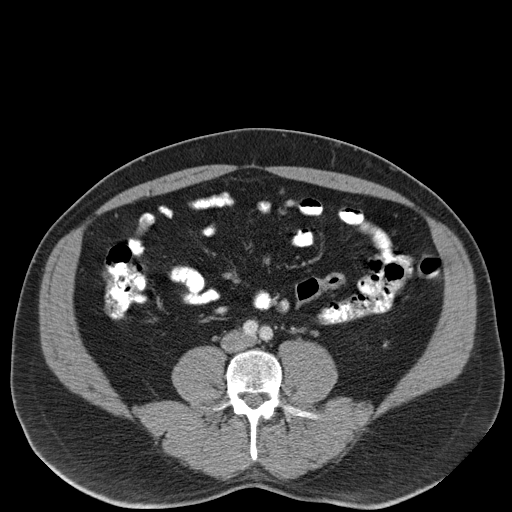
[im 51/96  soft-tissue]
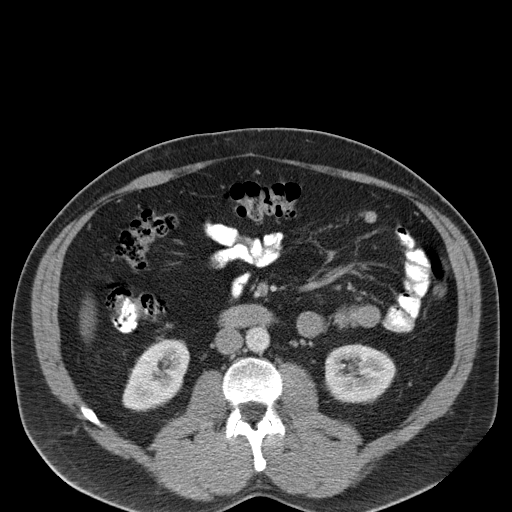
[im 56/96  soft-tissue]
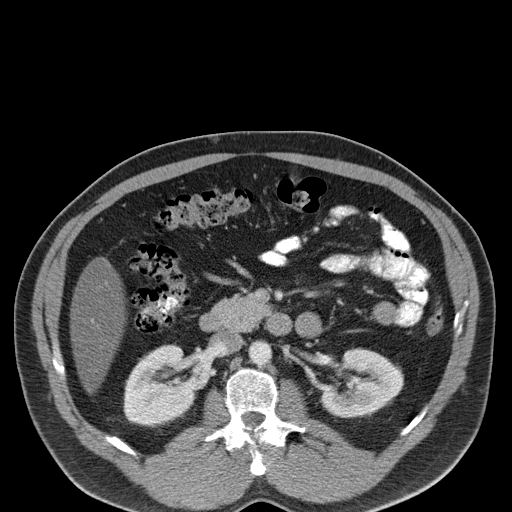
[im 61/96  soft-tissue]
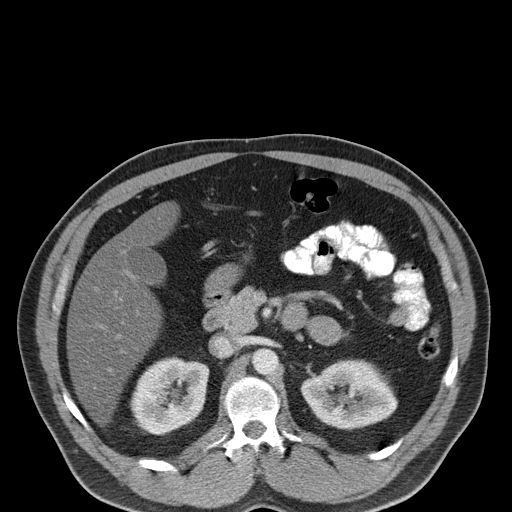
[im 61/96  bone]
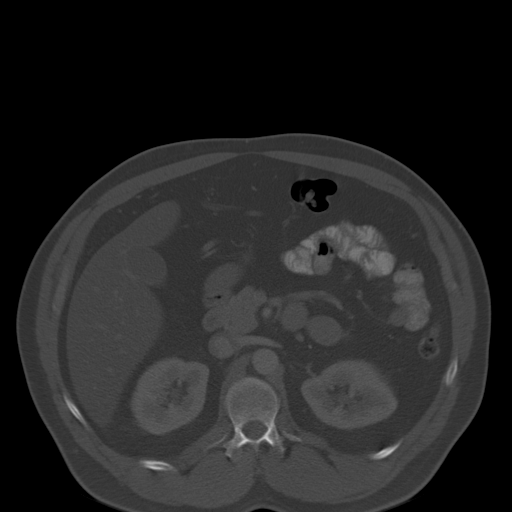
[im 71/96  soft-tissue]
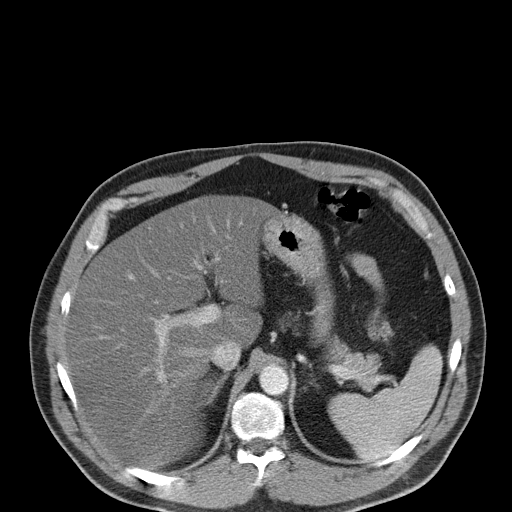
[im 76/96  soft-tissue]
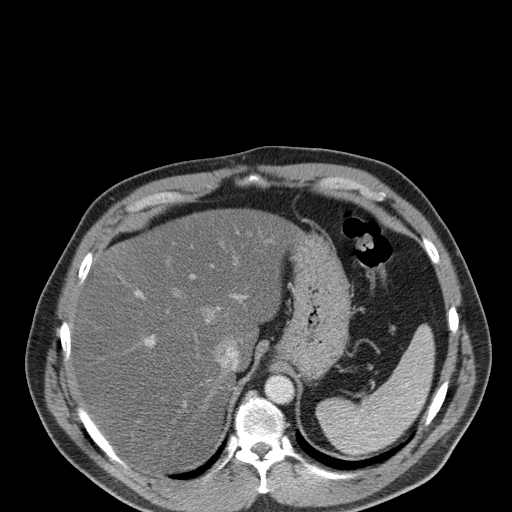
[im 81/96  soft-tissue]
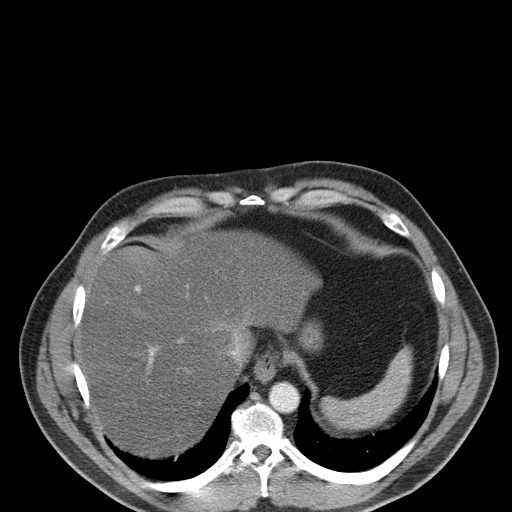
[im 91/96  soft-tissue]
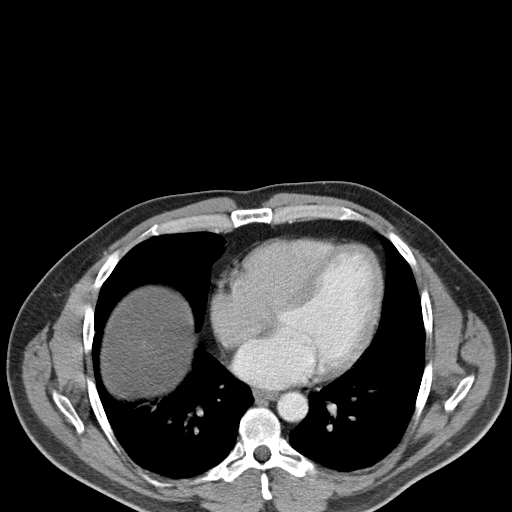

[Series 602: coronal · coronal · 0.96mm/px · 3 of 162 slices shown]
[im 54/162  soft-tissue]
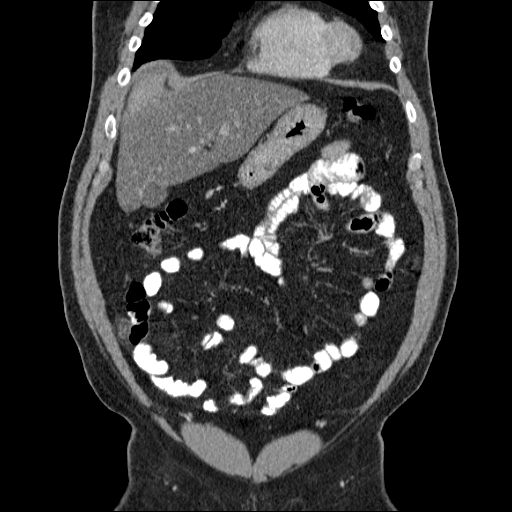
[im 72/162  soft-tissue]
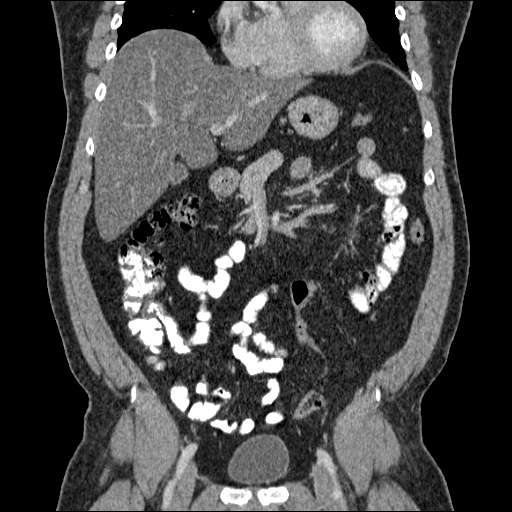
[im 90/162  soft-tissue]
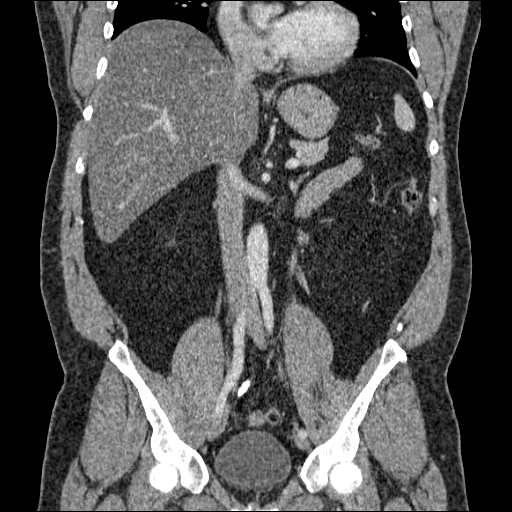

[16 of 46 positions shown; findings below may reference images not displayed]

FINDINGS: The lung bases are clear. The liver is low in attenuation consistent
with fatty infiltration with areas of sparing near the gallbladder.
No focal hepatic abnormality is seen. No calcified gallstones are
noted. The pancreas is normal in size and the pancreatic duct is not
dilated. The adrenal glands and spleen are unremarkable. The stomach
is decompressed. The kidneys enhance with no calculus or mass and no
hydronephrosis is seen. The abdominal aorta is normal in caliber. No
adenopathy is seen.

The urinary bladder is unremarkable. A small urachal remnant is
noted anteriorly. The prostate is normal in size. No fluid is seen
within the pelvis. There are a few scattered diverticula within the
colon. The colon is largely decompressed. The terminal ileum is
unremarkable. The appendix is visualized extending retrocecal and
then laterally. However adjacent to the tip of the appendix there is
an area of focal inflammation which appears to have a fatty center,
most typical of appendicitis epiploica. However due to the close
proximity of this small focal inflammatory process to the tip of the
appendix, clinical followup is recommended. No definite evidence of
acute appendicitis is seen. Degenerative disc disease is noted
primarily at L4-5.
IMPRESSION: 1. Focal inflammatory process within the right lower quadrant near
the tip of the appendix, but with an appearance most typical of
appendicitis epiploica.
2. Due to the close proximity of this focal inflammatory process to
the tip of the appendix, close clinical followup is recommended. The
appendix does appear to be normal in caliber.
3. Fatty infiltration of the liver with areas of sparing.

## 2015-04-24 ENCOUNTER — Other Ambulatory Visit: Payer: Self-pay | Admitting: Family Medicine

## 2015-05-17 ENCOUNTER — Encounter: Payer: Self-pay | Admitting: Family Medicine

## 2015-05-17 ENCOUNTER — Ambulatory Visit (INDEPENDENT_AMBULATORY_CARE_PROVIDER_SITE_OTHER): Payer: BLUE CROSS/BLUE SHIELD | Admitting: Family Medicine

## 2015-05-17 VITALS — BP 132/82 | HR 75 | Temp 98.2°F | Ht 70.0 in | Wt 268.0 lb

## 2015-05-17 DIAGNOSIS — K219 Gastro-esophageal reflux disease without esophagitis: Secondary | ICD-10-CM | POA: Diagnosis not present

## 2015-05-17 DIAGNOSIS — E785 Hyperlipidemia, unspecified: Secondary | ICD-10-CM | POA: Diagnosis not present

## 2015-05-17 MED ORDER — FENOFIBRATE 145 MG PO TABS: 145.0000 mg | ORAL_TABLET | Freq: Every day | ORAL | Status: DC

## 2015-05-17 MED ORDER — PANTOPRAZOLE SODIUM 40 MG PO TBEC: 40.0000 mg | DELAYED_RELEASE_TABLET | Freq: Two times a day (BID) | ORAL | Status: DC

## 2015-05-17 MED ORDER — ATORVASTATIN CALCIUM 40 MG PO TABS: 40.0000 mg | ORAL_TABLET | Freq: Every day | ORAL | Status: DC

## 2015-05-17 NOTE — Progress Notes (Signed)
Pre visit review using our clinic review tool, if applicable. No additional management support is needed unless otherwise documented below in the visit note. 

## 2015-05-17 NOTE — Progress Notes (Signed)
   Subjective:    Patient ID: [name] [name], male    DOB: January 21, 1961, 55 y.o.   MRN: [id number]  HPI Here to follow up. He has run out of some of the medications. His GERD is well controlled. He feels great but he admits to putting on weight.    Review of Systems  Constitutional: Negative.   Respiratory: Negative.   Cardiovascular: Negative.   Gastrointestinal: Negative.        Objective:   Physical Exam  Constitutional: He appears well-developed and well-nourished.  Neck: No thyromegaly present.  Cardiovascular: Normal rate, regular rhythm, normal heart sounds and intact distal pulses.   Pulmonary/Chest: Effort normal and breath sounds normal.  Abdominal: Soft. Bowel sounds are normal. He exhibits no distension and no mass. There is no tenderness. There is no rebound and no guarding.  Lymphadenopathy:    He has no cervical adenopathy.          Assessment & Plan:  His GERD is stable. For the lipids, he will set up fasting labs soon. He knows he needs to lose weight.

## 2015-06-04 ENCOUNTER — Other Ambulatory Visit (INDEPENDENT_AMBULATORY_CARE_PROVIDER_SITE_OTHER): Payer: BLUE CROSS/BLUE SHIELD

## 2015-06-04 DIAGNOSIS — Z0000 Encounter for general adult medical examination without abnormal findings: Secondary | ICD-10-CM

## 2015-06-04 LAB — CBC WITH DIFFERENTIAL/PLATELET
Basophils Absolute: 0 10*3/uL (ref 0.0–0.1)
Basophils Relative: 0.5 % (ref 0.0–3.0)
EOS ABS: 0.1 10*3/uL (ref 0.0–0.7)
EOS PCT: 2.3 % (ref 0.0–5.0)
HCT: 47.7 % (ref 39.0–52.0)
HEMOGLOBIN: 15.9 g/dL (ref 13.0–17.0)
Lymphocytes Relative: 39.3 % (ref 12.0–46.0)
Lymphs Abs: 2.1 10*3/uL (ref 0.7–4.0)
MCHC: 33.3 g/dL (ref 30.0–36.0)
MCV: 86.2 fl (ref 78.0–100.0)
Monocytes Absolute: 0.3 10*3/uL (ref 0.1–1.0)
Monocytes Relative: 6.6 % (ref 3.0–12.0)
Neutro Abs: 2.7 10*3/uL (ref 1.4–7.7)
Neutrophils Relative %: 51.3 % (ref 43.0–77.0)
Platelets: 173 10*3/uL (ref 150.0–400.0)
RBC: 5.54 Mil/uL (ref 4.22–5.81)
RDW: 13.6 % (ref 11.5–15.5)
WBC: 5.3 10*3/uL (ref 4.0–10.5)

## 2015-06-04 LAB — TSH: TSH: 0.42 u[IU]/mL (ref 0.35–4.50)

## 2015-06-04 LAB — LIPID PANEL
Cholesterol: 175 mg/dL (ref 0–200)
HDL: 41.8 mg/dL (ref >39.00)
LDL CALC: 98 mg/dL (ref 0–99)
NONHDL: 133.01
Total CHOL/HDL Ratio: 4
Triglycerides: 175 mg/dL — ABNORMAL HIGH (ref 0.0–149.0)
VLDL: 35 mg/dL (ref 0.0–40.0)

## 2015-06-04 LAB — HEPATIC FUNCTION PANEL
ALT: 42 U/L (ref 0–53)
AST: 25 U/L (ref 0–37)
Albumin: 4.7 g/dL (ref 3.5–5.2)
Alkaline Phosphatase: 56 U/L (ref 39–117)
BILIRUBIN TOTAL: 0.6 mg/dL (ref 0.2–1.2)
Bilirubin, Direct: 0.1 mg/dL (ref 0.0–0.3)
Total Protein: 7.2 g/dL (ref 6.0–8.3)

## 2015-06-04 LAB — BASIC METABOLIC PANEL
BUN: 21 mg/dL (ref 6–23)
CO2: 28 mEq/L (ref 19–32)
CREATININE: 1.03 mg/dL (ref 0.40–1.50)
Calcium: 10.1 mg/dL (ref 8.4–10.5)
Chloride: 107 mEq/L (ref 96–112)
GFR: 80.25 mL/min (ref >60.00)
Glucose, Bld: 111 mg/dL — ABNORMAL HIGH (ref 70–99)
POTASSIUM: 4.4 meq/L (ref 3.5–5.1)
Sodium: 141 mEq/L (ref 135–145)

## 2015-06-04 LAB — POC URINALSYSI DIPSTICK (AUTOMATED)
Bilirubin, UA: NEGATIVE
Blood, UA: NEGATIVE
Glucose, UA: NEGATIVE
KETONES UA: NEGATIVE
Leukocytes, UA: NEGATIVE
Nitrite, UA: NEGATIVE
PH UA: 7
PROTEIN UA: NEGATIVE
SPEC GRAV UA: 1.02
Urobilinogen, UA: 0.2

## 2015-06-04 LAB — PSA: PSA: 0.7 ng/mL (ref 0.10–4.00)

## 2016-05-19 ENCOUNTER — Other Ambulatory Visit: Payer: Self-pay | Admitting: Family Medicine

## 2016-07-30 ENCOUNTER — Other Ambulatory Visit: Payer: Self-pay | Admitting: Family Medicine

## 2016-09-02 ENCOUNTER — Other Ambulatory Visit: Payer: Self-pay | Admitting: Family Medicine

## 2017-07-08 ENCOUNTER — Encounter: Payer: Self-pay | Admitting: Internal Medicine

## 2018-06-29 ENCOUNTER — Ambulatory Visit (INDEPENDENT_AMBULATORY_CARE_PROVIDER_SITE_OTHER): Payer: BLUE CROSS/BLUE SHIELD | Admitting: Family Medicine

## 2018-06-29 ENCOUNTER — Encounter: Payer: Self-pay | Admitting: Family Medicine

## 2018-06-29 VITALS — BP 132/84 | HR 83 | Temp 97.9°F | Wt 277.1 lb

## 2018-06-29 DIAGNOSIS — S46912A Strain of unspecified muscle, fascia and tendon at shoulder and upper arm level, left arm, initial encounter: Secondary | ICD-10-CM

## 2018-06-29 MED ORDER — OXYCODONE-ACETAMINOPHEN 10-325 MG PO TABS: 1.0000 | ORAL_TABLET | ORAL | 0 refills | Status: AC | PRN

## 2018-06-29 MED ORDER — METHYLPREDNISOLONE 4 MG PO TBPK
ORAL_TABLET | ORAL | 0 refills | Status: AC
Start: 2018-06-29 — End: ?
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
